# Patient Record
Sex: Male | Born: 1980 | Race: White | Hispanic: No | Marital: Single | Smoking: Current some day smoker
Health system: Southern US, Community
[De-identification: ages and names within clinical notes are randomized; demographics above are authoritative.]

## PROBLEM LIST (undated history)

## (undated) DIAGNOSIS — K8020 Calculus of gallbladder without cholecystitis without obstruction: Secondary | ICD-10-CM

## (undated) DIAGNOSIS — K838 Other specified diseases of biliary tract: Secondary | ICD-10-CM

## (undated) DIAGNOSIS — K8050 Calculus of bile duct without cholangitis or cholecystitis without obstruction: Secondary | ICD-10-CM

## (undated) DIAGNOSIS — K760 Fatty (change of) liver, not elsewhere classified: Secondary | ICD-10-CM

## (undated) DIAGNOSIS — L4050 Arthropathic psoriasis, unspecified: Secondary | ICD-10-CM

## (undated) HISTORY — DX: Calculus of bile duct without cholangitis or cholecystitis without obstruction: K80.50

## (undated) HISTORY — DX: Other specified diseases of biliary tract: K83.8

## (undated) HISTORY — PX: ANTERIOR CRUCIATE LIGAMENT REPAIR: SHX115

## (undated) HISTORY — PX: DENTAL SURGERY: SHX609

## (undated) HISTORY — DX: Fatty (change of) liver, not elsewhere classified: K76.0

## (undated) HISTORY — DX: Calculus of gallbladder without cholecystitis without obstruction: K80.20

---

## 2013-05-07 ENCOUNTER — Encounter (HOSPITAL_COMMUNITY): Payer: Self-pay

## 2013-05-07 ENCOUNTER — Emergency Department (HOSPITAL_COMMUNITY)
Admission: EM | Admit: 2013-05-07 | Discharge: 2013-05-07 | Disposition: A | Discharge status: Home / Self Care | Payer: 59 | Attending: Emergency Medicine | Admitting: Emergency Medicine

## 2013-05-07 DIAGNOSIS — R1013 Epigastric pain: Secondary | ICD-10-CM | POA: Insufficient documentation

## 2013-05-07 DIAGNOSIS — R109 Unspecified abdominal pain: Secondary | ICD-10-CM

## 2013-05-07 DIAGNOSIS — F17200 Nicotine dependence, unspecified, uncomplicated: Secondary | ICD-10-CM | POA: Insufficient documentation

## 2013-05-07 LAB — URINALYSIS, ROUTINE W REFLEX MICROSCOPIC
Bilirubin Urine: NEGATIVE
Glucose, UA: NEGATIVE mg/dL
Hgb urine dipstick: NEGATIVE
Ketones, ur: NEGATIVE mg/dL
Leukocytes, UA: NEGATIVE
pH: 6.5 (ref 5.0–8.0)

## 2013-05-07 LAB — COMPREHENSIVE METABOLIC PANEL
ALT: 18 U/L (ref 0–53)
AST: 13 U/L (ref 0–37)
Albumin: 4.2 g/dL (ref 3.5–5.2)
Alkaline Phosphatase: 81 U/L (ref 39–117)
Chloride: 103 mEq/L (ref 96–112)
Potassium: 3.4 mEq/L — ABNORMAL LOW (ref 3.5–5.1)
Sodium: 141 mEq/L (ref 135–145)
Total Protein: 7.1 g/dL (ref 6.0–8.3)

## 2013-05-07 LAB — CBC WITH DIFFERENTIAL/PLATELET
Basophils Relative: 0 % (ref 0–1)
Eosinophils Absolute: 0.2 10*3/uL (ref 0.0–0.7)
MCH: 30.6 pg (ref 26.0–34.0)
MCHC: 34.7 g/dL (ref 30.0–36.0)
Neutro Abs: 5.7 10*3/uL (ref 1.7–7.7)
Neutrophils Relative %: 67 % (ref 43–77)
Platelets: 232 10*3/uL (ref 150–400)
RBC: 4.35 MIL/uL (ref 4.22–5.81)

## 2013-05-07 MED ORDER — GI COCKTAIL ~~LOC~~: 30.0000 mL | ORAL | Status: DC | PRN

## 2013-05-07 MED ORDER — PANTOPRAZOLE SODIUM 20 MG PO TBEC: 20.0000 mg | DELAYED_RELEASE_TABLET | Freq: Every day | ORAL | Status: DC

## 2013-05-07 MED ORDER — TRAMADOL HCL 50 MG PO TABS
50.0000 mg | ORAL_TABLET | Freq: Four times a day (QID) | ORAL | Status: DC | PRN
Start: 2013-05-07 — End: 2016-01-30

## 2013-05-07 MED ORDER — PANTOPRAZOLE SODIUM 40 MG IV SOLR
40.0000 mg | INTRAVENOUS | Status: AC
  Administered 2013-05-07: 40 mg via INTRAVENOUS
  Filled 2013-05-07: qty 40

## 2013-05-07 MED ORDER — GI COCKTAIL ~~LOC~~
30.0000 mL | Freq: Once | ORAL | Status: AC
  Administered 2013-05-07: 30 mL via ORAL
  Filled 2013-05-07: qty 30

## 2013-05-07 NOTE — ED Notes (Signed)
Patient reports that he has had intermittent epigastric pain and lower abdominal bloating. Patient states that for the past week he has had more frequent episodes and lasting longer. He c/o epigastric pain lasting greater than 12 hours last night and this AM. Patient describes pain as aching tht radiates into his mid back.

## 2013-05-07 NOTE — ED Notes (Signed)
Patient explained that he preferred not to have blood work drawn until seen by a physician.

## 2013-05-07 NOTE — ED Provider Notes (Signed)
History     CSN: [id number]  Arrival date & time 05/07/13  0911   First MD Initiated Contact with Patient 05/07/13 0913      Chief Complaint  Patient presents with  . Abdominal Pain    (Consider location/radiation/quality/duration/timing/severity/associated sxs/prior treatment) HPI Comments: Patient is a 32 year old male with no past medical history who presents with a 1 week history of abdominal pain. The pain is located in his epigastrium and radiates to his mid back. The pain is described as aching and moderate. The pain is intermittent without known trigger. Patient reports noticing the pain after drinking orange juice. The pain started gradually and progressively worsened since the onset. The pain will last 24-48 hours before resolving. The pain usually starts at night. Patient tried TUMS for pain which provided relief, but another time he tried them he had no relief. Associated symptoms include bloating and gas. Patient states he has been experiencing this problem for a while but has noticed more frequent episodes in the past week. Patient denies fever, headache, NVD, chest pain, SOB, dysuria, constipation.    Patient is a 32 y.o. male presenting with abdominal pain.  Abdominal Pain Associated symptoms include abdominal pain.    History reviewed. No pertinent past medical history.  Past Surgical History  Procedure Laterality Date  . Anterior cruciate ligament repair Right   . Dental surgery      Family History  Problem Relation Age of Onset  . Cancer Maternal Aunt     History  Substance Use Topics  . Smoking status: Current Some Day Smoker  . Smokeless tobacco: Never Used  . Alcohol Use: Yes     Comment: rarely      Review of Systems  Gastrointestinal: Positive for abdominal pain.  All other systems reviewed and are negative.    Allergies  Review of patient's allergies indicates not on file.  Home Medications  No current outpatient prescriptions on  file.  BP 158/88  Pulse 63  Temp(Src) 98 F (36.7 C) (Oral)  Resp 18  Ht 6' (1.829 m)  Wt 220 lb (99.791 kg)  BMI 29.83 kg/m2  SpO2 98%  Physical Exam  Nursing note and vitals reviewed. Constitutional: He is oriented to person, place, and time. He appears well-developed and well-nourished. No distress.  HENT:  Head: Normocephalic and atraumatic.  Eyes: Conjunctivae are normal. No scleral icterus.  Neck: Normal range of motion.  Cardiovascular: Normal rate and regular rhythm.  Exam reveals no gallop and no friction rub.   No murmur heard. Pulmonary/Chest: Effort normal and breath sounds normal. He has no wheezes. He has no rales. He exhibits no tenderness.  Abdominal: Soft. He exhibits no distension. There is tenderness. There is no rebound and no guarding.  Mild epigastric tenderness to palpation. No peritoneal signs or focal areas of tenderness.   Musculoskeletal: Normal range of motion.  Neurological: He is alert and oriented to person, place, and time. Coordination normal.  Speech is goal-oriented. Moves limbs without ataxia.   Skin: Skin is warm and dry.  Psychiatric: He has a normal mood and affect. His behavior is normal.    ED Course  Procedures (including critical care time)  Labs Reviewed  CBC WITH DIFFERENTIAL - Abnormal; Notable for the following:    HCT 38.3 (*)    All other components within normal limits  COMPREHENSIVE METABOLIC PANEL - Abnormal; Notable for the following:    Potassium 3.4 (*)    Glucose, Bld 135 (*)  All other components within normal limits  URINALYSIS, ROUTINE W REFLEX MICROSCOPIC - Abnormal; Notable for the following:    APPearance CLOUDY (*)    All other components within normal limits  LIPASE, BLOOD   No results found.   1. Abdominal pain       MDM  10:17 AM Labs pending. Patient will have GI cocktail and IV protonix for symptoms. Vitals stable and patient afebrile.   12:00 PM Patient reports some relief. Labs  unremarkable for acute changes. Patient will have prescription for daily protonix and GI cocktail. Patient will have recommended GI cocktail. Vitals stable and patient afebrile.   Patient feeling better. Patient will have Protonix prescription and instructions to follow up with GI. Vitals stable and patient afebrile. Patient instructed to return with worsening or concerning symptoms.     Emilia Beck, PA-C 05/09/13 1039

## 2013-05-10 NOTE — ED Provider Notes (Signed)
Medical screening examination/treatment/procedure(s) were conducted as a shared visit with non-physician practitioner(s) and myself.  I personally evaluated the patient during the encounter.  Kendarius Vigen, MD 05/10/13 0740 

## 2016-01-30 ENCOUNTER — Encounter (HOSPITAL_COMMUNITY): Payer: Self-pay | Admitting: Emergency Medicine

## 2016-01-30 ENCOUNTER — Emergency Department (HOSPITAL_COMMUNITY)
Admission: EM | Admit: 2016-01-30 | Discharge: 2016-01-30 | Disposition: A | Discharge status: Home / Self Care | Payer: 59 | Attending: Emergency Medicine | Admitting: Emergency Medicine

## 2016-01-30 ENCOUNTER — Emergency Department (HOSPITAL_COMMUNITY): Payer: 59

## 2016-01-30 DIAGNOSIS — R1010 Upper abdominal pain, unspecified: Secondary | ICD-10-CM | POA: Insufficient documentation

## 2016-01-30 DIAGNOSIS — R1011 Right upper quadrant pain: Secondary | ICD-10-CM

## 2016-01-30 DIAGNOSIS — F17200 Nicotine dependence, unspecified, uncomplicated: Secondary | ICD-10-CM | POA: Diagnosis not present

## 2016-01-30 DIAGNOSIS — M549 Dorsalgia, unspecified: Secondary | ICD-10-CM | POA: Diagnosis not present

## 2016-01-30 DIAGNOSIS — R109 Unspecified abdominal pain: Secondary | ICD-10-CM | POA: Diagnosis present

## 2016-01-30 DIAGNOSIS — Z79899 Other long term (current) drug therapy: Secondary | ICD-10-CM | POA: Diagnosis not present

## 2016-01-30 DIAGNOSIS — Z872 Personal history of diseases of the skin and subcutaneous tissue: Secondary | ICD-10-CM | POA: Insufficient documentation

## 2016-01-30 HISTORY — DX: Arthropathic psoriasis, unspecified: L40.50

## 2016-01-30 LAB — URINALYSIS, ROUTINE W REFLEX MICROSCOPIC
Bilirubin Urine: NEGATIVE
GLUCOSE, UA: NEGATIVE mg/dL
HGB URINE DIPSTICK: NEGATIVE
Ketones, ur: NEGATIVE mg/dL
Leukocytes, UA: NEGATIVE
Nitrite: NEGATIVE
PH: 7 (ref 5.0–8.0)
PROTEIN: NEGATIVE mg/dL
Specific Gravity, Urine: 1.029 (ref 1.005–1.030)

## 2016-01-30 LAB — COMPREHENSIVE METABOLIC PANEL
ALK PHOS: 78 U/L (ref 38–126)
ALT: 41 U/L (ref 17–63)
AST: 21 U/L (ref 15–41)
Albumin: 4.3 g/dL (ref 3.5–5.0)
Anion gap: 11 (ref 5–15)
BUN: 13 mg/dL (ref 6–20)
CHLORIDE: 106 mmol/L (ref 101–111)
CO2: 26 mmol/L (ref 22–32)
CREATININE: 0.97 mg/dL (ref 0.61–1.24)
Calcium: 9.4 mg/dL (ref 8.9–10.3)
GFR calc Af Amer: >60 mL/min (ref >60)
GFR calc non Af Amer: >60 mL/min (ref >60)
GLUCOSE: 109 mg/dL — AB (ref 65–99)
Potassium: 4.1 mmol/L (ref 3.5–5.1)
SODIUM: 143 mmol/L (ref 135–145)
Total Bilirubin: 0.6 mg/dL (ref 0.3–1.2)
Total Protein: 7.8 g/dL (ref 6.5–8.1)

## 2016-01-30 LAB — CBC
HCT: 42.1 % (ref 39.0–52.0)
Hemoglobin: 14.3 g/dL (ref 13.0–17.0)
MCH: 30.5 pg (ref 26.0–34.0)
MCHC: 34 g/dL (ref 30.0–36.0)
MCV: 89.8 fL (ref 78.0–100.0)
PLATELETS: 307 10*3/uL (ref 150–400)
RBC: 4.69 MIL/uL (ref 4.22–5.81)
RDW: 14 % (ref 11.5–15.5)
WBC: 9.3 10*3/uL (ref 4.0–10.5)

## 2016-01-30 LAB — LIPASE, BLOOD: LIPASE: 34 U/L (ref 11–51)

## 2016-01-30 MED ORDER — PANTOPRAZOLE SODIUM 40 MG IV SOLR
40.0000 mg | Freq: Once | INTRAVENOUS | Status: AC
  Administered 2016-01-30: 40 mg via INTRAVENOUS
  Filled 2016-01-30: qty 40

## 2016-01-30 MED ORDER — FAMOTIDINE IN NACL 20-0.9 MG/50ML-% IV SOLN
20.0000 mg | Freq: Once | INTRAVENOUS | Status: AC
  Administered 2016-01-30: 20 mg via INTRAVENOUS
  Filled 2016-01-30: qty 50

## 2016-01-30 MED ORDER — PANTOPRAZOLE SODIUM 20 MG PO TBEC: 20.0000 mg | DELAYED_RELEASE_TABLET | Freq: Two times a day (BID) | ORAL | Status: DC

## 2016-01-30 NOTE — ED Provider Notes (Signed)
CSN: [id number]     Arrival date & time 01/30/16  0146 History  By signing my name below, I, Linus GalasMaharshi Patel, attest that this documentation has been prepared under the direction and in the presence of Raeford RazorStephen Sayyid Harewood, MD. Electronically Signed: Linus GalasMaharshi Patel, ED Scribe. 01/30/2016. 3:56 AM.  Chief Complaint  Patient presents with  . Abdominal Pain   The history is provided by the patient. No language interpreter was used.   HPI Comments: Craig Roth is a 35 y.o. male who presents to the Emergency Department complaining of stabbing abdominal pain that has been ongoing for years but has worsened for the past 3 days. Pt notes that lately he has been having more frequent episodes of abdominal pain. He notes worsening pain after eating cheese and fatty foods. Pt states his pain radiates to his back. He has been taking alka seltzer and pepto-bismol with mild relief.  Pt denies any fevers, chills, nausea, vomiting, diarrhea, urinary symptoms, or any other symptoms at this time.   Past Medical History  Diagnosis Date  . Psoriatic arthritis [facility](HCC)    Past Surgical History  Procedure Laterality Date  . Anterior cruciate ligament repair Right   . Dental surgery     Family History  Problem Relation Age of Onset  . Cancer Maternal Aunt    Social History  Substance Use Topics  . Smoking status: Current Some Day Smoker  . Smokeless tobacco: Never Used  . Alcohol Use: Yes     Comment: rarely    Review of Systems  Constitutional: Negative for fever and chills.  Gastrointestinal: Positive for abdominal pain. Negative for nausea, vomiting and diarrhea.  Genitourinary: Negative for urgency, frequency and difficulty urinating.  Musculoskeletal: Positive for back pain.  All other systems reviewed and are negative.   Allergies  Review of patient's allergies indicates no known allergies.  Home Medications   Prior to Admission medications   Medication Sig Start Date End Date Taking? Authorizing  Provider  Alum & Mag Hydroxide-Simeth (GI COCKTAIL) SUSP suspension Take 30 mLs by mouth as needed for indigestion. Shake well. 05/07/13   Emilia BeckKaitlyn Szekalski, PA-C  Multiple Vitamin (MULTIVITAMIN WITH MINERALS) TABS Take 1 tablet by mouth every morning.    Historical Provider, MD  pantoprazole (PROTONIX) 20 MG tablet Take 1 tablet (20 mg total) by mouth daily. 05/07/13   Kaitlyn Szekalski, PA-C  traMADol (ULTRAM) 50 MG tablet Take 1 tablet (50 mg total) by mouth every 6 (six) hours as needed for pain. 05/07/13   Kaitlyn Szekalski, PA-C   BP 139/87 mmHg  Pulse 98  Temp(Src) 98.2 F (36.8 C) (Oral)  Resp 18  SpO2 100%   Physical Exam  Constitutional: He appears well-developed and well-nourished. No distress.  HENT:  Head: Normocephalic and atraumatic.  Right Ear: External ear normal.  Left Ear: External ear normal.  Eyes: Conjunctivae are normal. Right eye exhibits no discharge. Left eye exhibits no discharge. No scleral icterus.  Neck: Neck supple. No tracheal deviation present.  Cardiovascular: Normal rate, regular rhythm and intact distal pulses.   Pulmonary/Chest: Effort normal and breath sounds normal. No stridor. No respiratory distress. He has no wheezes. He has no rales.  Abdominal: Soft. Bowel sounds are normal. He exhibits no distension. There is no tenderness. There is no rebound and no guarding.  Musculoskeletal: He exhibits no edema or tenderness.  Neurological: He is alert. He has normal strength. No cranial nerve deficit (no facial droop, extraocular movements intact, no slurred speech) or sensory deficit.  He exhibits normal muscle tone. He displays no seizure activity. Coordination normal.  Skin: Skin is warm and dry. No rash noted.  Psychiatric: He has a normal mood and affect.  Nursing note and vitals reviewed.   ED Course  Procedures   DIAGNOSTIC STUDIES: Oxygen Saturation is 100% on room air, normal by my interpretation.    COORDINATION OF CARE: 3:50 AM Will order  blood work and urinalysis. Discussed treatment plan with pt at bedside and pt agreed to plan.   Labs Review Labs Reviewed  COMPREHENSIVE METABOLIC PANEL - Abnormal; Notable for the following:    Glucose, Bld 109 (*)    All other components within normal limits  LIPASE, BLOOD  CBC  URINALYSIS, ROUTINE W REFLEX MICROSCOPIC (NOT AT [facility])    Imaging Review No results found.   US Abdomen Limited Ruq  01/30/2016  CLINICAL DATA:  Right upper quadrant pain for 3 days EXAM: US ABDOMEN LIMITED - RIGHT UPPER QUADRANT COMPARISON:  None. FINDINGS: Gallbladder: No gallstones or wall thickening visualized. No sonographic Murphy sign noted by sonographer. Common bile duct: Diameter: 17 mm. Where visualized, no filling defect. This marked dilatation could be from occult obstructive process or congenital dilatation. Liver: Echogenic liver without focal mass lesion. IMPRESSION: 1. Dilated common bile duct (17 mm) without visible cause. Recommend follow-up with MRCP. 2. Negative gallbladder. 3. Hepatic steatosis. Electronically Signed   By: Marnee Spring M.D.   On: 01/30/2016 04:54   I have personally reviewed and evaluated these images and lab results as part of my medical decision-making.   EKG Interpretation None      MDM   Final diagnoses:  Pain of upper abdomen    34yM with upper abdominal pain. CBD very dilated. Congenital? Symptoms began again 3 days ago and I would expect some lab abnormalities with symptom duration this long. Abdominal exam benign. At this point, I feel safe for discharge. Strict return precautions discussed. GI FU. Trial of PPI.  I personally preformed the services scribed in my presence. The recorded information has been reviewed is accurate. Raeford Razor, MD.    Raeford Razor, MD 02/04/16 [phone]

## 2016-01-30 NOTE — Discharge Instructions (Signed)
Your ultrasound today showed that your common bile duct is significantly dilated.  This may be congenital. This can be caused by blockages in your bile duct system, but you do not have any lab abnormalities which would suggest this. You are going to be started on a proton pump inhibitor (protonix). I am recommending you follow up with GI for persistent symptoms.   Abdominal Pain, Adult Many things can cause abdominal pain. Usually, abdominal pain is not caused by a disease and will improve without treatment. It can often be observed and treated at home. Your health care provider will do a physical exam and possibly order blood tests and X-rays to help determine the seriousness of your pain. However, in many cases, more time must pass before a clear cause of the pain can be found. Before that point, your health care provider may not know if you need more testing or further treatment. HOME CARE INSTRUCTIONS Monitor your abdominal pain for any changes. The following actions may help to alleviate any discomfort you are experiencing:  Only take over-the-counter or prescription medicines as directed by your health care provider.  Do not take laxatives unless directed to do so by your health care provider.  Try a clear liquid diet (broth, tea, or water) as directed by your health care provider. Slowly move to a bland diet as tolerated. SEEK MEDICAL CARE IF:  You have unexplained abdominal pain.  You have abdominal pain associated with nausea or diarrhea.  You have pain when you urinate or have a bowel movement.  You experience abdominal pain that wakes you in the night.  You have abdominal pain that is worsened or improved by eating food.  You have abdominal pain that is worsened with eating fatty foods.  You have a fever. SEEK IMMEDIATE MEDICAL CARE IF:  Your pain does not go away within 2 hours.  You keep throwing up (vomiting).  Your pain is felt only in portions of the abdomen, such as  the right side or the left lower portion of the abdomen.  You pass bloody or black tarry stools. MAKE SURE YOU:  Understand these instructions.  Will watch your condition.  Will get help right away if you are not doing well or get worse.   This information is not intended to replace advice given to you by your health care provider. Make sure you discuss any questions you have with your health care provider.   Document Released: 08/15/2005 Document Revised: 07/27/2015 Document Reviewed: 07/15/2013 Elsevier Interactive Patient Education [employer]2016 Elsevier Inc.

## 2016-01-30 NOTE — ED Notes (Signed)
Pt c/o upper abd pain x 3 days.  Denies any NVD.  Pt denies any major medical problems.

## 2016-02-13 ENCOUNTER — Encounter: Payer: Self-pay | Admitting: Internal Medicine

## 2016-02-18 ENCOUNTER — Encounter (HOSPITAL_COMMUNITY): Payer: Self-pay | Admitting: Emergency Medicine

## 2016-02-18 ENCOUNTER — Emergency Department (HOSPITAL_COMMUNITY): Payer: 59

## 2016-02-18 ENCOUNTER — Inpatient Hospital Stay (HOSPITAL_COMMUNITY)
Admission: EM | Admit: 2016-02-18 | Discharge: 2016-02-23 | DRG: 419 | Disposition: A | Discharge status: Home / Self Care | Payer: 59 | Attending: General Surgery | Admitting: General Surgery

## 2016-02-18 DIAGNOSIS — K8042 Calculus of bile duct with acute cholecystitis without obstruction: Secondary | ICD-10-CM | POA: Diagnosis present

## 2016-02-18 DIAGNOSIS — R1010 Upper abdominal pain, unspecified: Secondary | ICD-10-CM

## 2016-02-18 DIAGNOSIS — Z6832 Body mass index (BMI) 32.0-32.9, adult: Secondary | ICD-10-CM | POA: Diagnosis not present

## 2016-02-18 DIAGNOSIS — R109 Unspecified abdominal pain: Secondary | ICD-10-CM | POA: Diagnosis present

## 2016-02-18 DIAGNOSIS — K8067 Calculus of gallbladder and bile duct with acute and chronic cholecystitis with obstruction: Principal | ICD-10-CM | POA: Diagnosis present

## 2016-02-18 DIAGNOSIS — Z09 Encounter for follow-up examination after completed treatment for conditions other than malignant neoplasm: Secondary | ICD-10-CM

## 2016-02-18 DIAGNOSIS — F17200 Nicotine dependence, unspecified, uncomplicated: Secondary | ICD-10-CM | POA: Diagnosis present

## 2016-02-18 DIAGNOSIS — K8050 Calculus of bile duct without cholangitis or cholecystitis without obstruction: Secondary | ICD-10-CM

## 2016-02-18 DIAGNOSIS — K8020 Calculus of gallbladder without cholecystitis without obstruction: Secondary | ICD-10-CM | POA: Diagnosis present

## 2016-02-18 LAB — COMPREHENSIVE METABOLIC PANEL
ALBUMIN: 4.1 g/dL (ref 3.5–5.0)
ALT: 234 U/L — ABNORMAL HIGH (ref 17–63)
AST: 295 U/L — AB (ref 15–41)
Alkaline Phosphatase: 93 U/L (ref 38–126)
Anion gap: 9 (ref 5–15)
BUN: 9 mg/dL (ref 6–20)
CHLORIDE: 110 mmol/L (ref 101–111)
CO2: 26 mmol/L (ref 22–32)
Calcium: 9.2 mg/dL (ref 8.9–10.3)
Creatinine, Ser: 0.94 mg/dL (ref 0.61–1.24)
GFR calc Af Amer: >60 mL/min (ref >60)
GFR calc non Af Amer: >60 mL/min (ref >60)
GLUCOSE: 112 mg/dL — AB (ref 65–99)
POTASSIUM: 4.8 mmol/L (ref 3.5–5.1)
SODIUM: 145 mmol/L (ref 135–145)
Total Bilirubin: 1.1 mg/dL (ref 0.3–1.2)
Total Protein: 7.3 g/dL (ref 6.5–8.1)

## 2016-02-18 LAB — URINALYSIS, ROUTINE W REFLEX MICROSCOPIC
Bilirubin Urine: NEGATIVE
Glucose, UA: NEGATIVE mg/dL
KETONES UR: NEGATIVE mg/dL
LEUKOCYTES UA: NEGATIVE
Nitrite: NEGATIVE
PROTEIN: NEGATIVE mg/dL
Specific Gravity, Urine: 1.019 (ref 1.005–1.030)
pH: 6.5 (ref 5.0–8.0)

## 2016-02-18 LAB — CBC
HCT: 42.7 % (ref 39.0–52.0)
Hemoglobin: 14.3 g/dL (ref 13.0–17.0)
MCH: 28.7 pg (ref 26.0–34.0)
MCHC: 33.5 g/dL (ref 30.0–36.0)
MCV: 85.7 fL (ref 78.0–100.0)
PLATELETS: 333 10*3/uL (ref 150–400)
RBC: 4.98 MIL/uL (ref 4.22–5.81)
RDW: 13.9 % (ref 11.5–15.5)
WBC: 10 10*3/uL (ref 4.0–10.5)

## 2016-02-18 LAB — URINE MICROSCOPIC-ADD ON
BACTERIA UA: NONE SEEN
Squamous Epithelial / LPF: NONE SEEN

## 2016-02-18 LAB — LIPASE, BLOOD: Lipase: 27 U/L (ref 11–51)

## 2016-02-18 MED ORDER — SIMETHICONE 80 MG PO CHEW: 40.0000 mg | CHEWABLE_TABLET | Freq: Four times a day (QID) | ORAL | Status: DC | PRN

## 2016-02-18 MED ORDER — IOPAMIDOL (ISOVUE-300) INJECTION 61%
100.0000 mL | Freq: Once | INTRAVENOUS | Status: AC | PRN
  Administered 2016-02-18: 100 mL via INTRAVENOUS

## 2016-02-18 MED ORDER — PIPERACILLIN-TAZOBACTAM 3.375 G IVPB
3.3750 g | Freq: Three times a day (TID) | INTRAVENOUS | Status: DC
  Administered 2016-02-18 – 2016-02-23 (×13): 3.375 g via INTRAVENOUS
  Filled 2016-02-18 (×17): qty 50

## 2016-02-18 MED ORDER — SODIUM CHLORIDE 0.9 % IV SOLN
INTRAVENOUS | Status: DC
  Administered 2016-02-18 – 2016-02-19 (×2): via INTRAVENOUS
  Administered 2016-02-21: 1000 mL via INTRAVENOUS

## 2016-02-18 MED ORDER — ENOXAPARIN SODIUM 40 MG/0.4ML ~~LOC~~ SOLN
40.0000 mg | Freq: Every day | SUBCUTANEOUS | Status: DC
  Administered 2016-02-18 – 2016-02-20 (×3): 40 mg via SUBCUTANEOUS
  Filled 2016-02-18 (×4): qty 0.4

## 2016-02-18 MED ORDER — IOHEXOL 300 MG/ML  SOLN
25.0000 mL | Freq: Once | INTRAMUSCULAR | Status: AC | PRN
  Administered 2016-02-18: 25 mL via ORAL

## 2016-02-18 MED ORDER — SODIUM CHLORIDE 0.9 % IV BOLUS (SEPSIS)
1000.0000 mL | Freq: Once | INTRAVENOUS | Status: AC
  Administered 2016-02-18: 1000 mL via INTRAVENOUS

## 2016-02-18 MED ORDER — MORPHINE SULFATE (PF) 2 MG/ML IV SOLN
2.0000 mg | INTRAVENOUS | Status: DC | PRN
  Administered 2016-02-18 – 2016-02-20 (×13): 4 mg via INTRAVENOUS
  Administered 2016-02-21: 2 mg via INTRAVENOUS
  Administered 2016-02-21 (×2): 4 mg via INTRAVENOUS
  Administered 2016-02-21 – 2016-02-22 (×2): 2 mg via INTRAVENOUS
  Administered 2016-02-22 (×3): 4 mg via INTRAVENOUS
  Filled 2016-02-18: qty 2
  Filled 2016-02-18: qty 1
  Filled 2016-02-18 (×5): qty 2
  Filled 2016-02-18: qty 1
  Filled 2016-02-18 (×10): qty 2
  Filled 2016-02-18: qty 1
  Filled 2016-02-18 (×2): qty 2

## 2016-02-18 MED ORDER — MORPHINE SULFATE (PF) 4 MG/ML IV SOLN
4.0000 mg | Freq: Once | INTRAVENOUS | Status: AC
  Administered 2016-02-18: 4 mg via INTRAVENOUS
  Filled 2016-02-18: qty 1

## 2016-02-18 MED ORDER — ONDANSETRON 4 MG PO TBDP
4.0000 mg | ORAL_TABLET | Freq: Four times a day (QID) | ORAL | Status: DC | PRN
Start: 2016-02-18 — End: 2016-02-23

## 2016-02-18 MED ORDER — PANTOPRAZOLE SODIUM 20 MG PO TBEC
20.0000 mg | DELAYED_RELEASE_TABLET | Freq: Two times a day (BID) | ORAL | Status: DC
  Administered 2016-02-19 – 2016-02-23 (×6): 20 mg via ORAL
  Filled 2016-02-18 (×11): qty 1

## 2016-02-18 MED ORDER — ONDANSETRON HCL 4 MG/2ML IJ SOLN
4.0000 mg | Freq: Four times a day (QID) | INTRAMUSCULAR | Status: DC | PRN
  Administered 2016-02-20: 4 mg via INTRAVENOUS

## 2016-02-18 NOTE — ED Provider Notes (Signed)
CSN: [id number]     Arrival date & time 02/18/16  1501 History   First MD Initiated Contact with Patient 02/18/16 1518     Chief Complaint  Patient presents with  . Abdominal Pain  . Emesis  . Diarrhea   (Consider location/radiation/quality/duration/timing/severity/associated sxs/prior Treatment) HPI 35 y.o. male presents to the Emergency Department today complaining of upper abdominal pain that has been ongoing for several years, but worsening within the past few weeks. States that he was seen in the ED on the 13th due to similar symptoms. They did a RUQ [place] and found a dilated common bile duct with no stones present. DCed with follow up to GI on May 15th. Pt presents due to new onset N/V after drinking water today. Noted that he tolerated oatmeal this morning without difficulty. States pain is 8/10 and is banded across his abdomen with intermittent RUQ pain that is sharp with no inciting factors. Pt notes that he can't sleep due to the pain. Patient states that he has been careful with watching what he eats as well as avoiding drinking alcohol. No fevers. No CP/SOB. No headache. No numbness/tingling. No other symptoms noted.   Past Medical History  Diagnosis Date  . Psoriatic arthritis [facility])    Past Surgical History  Procedure Laterality Date  . Anterior cruciate ligament repair Right   . Dental surgery     Family History  Problem Relation Age of Onset  . Cancer Maternal Aunt    Social History  Substance Use Topics  . Smoking status: Current Some Day Smoker  . Smokeless tobacco: Never Used  . Alcohol Use: Yes     Comment: rarely    Review of Systems ROS reviewed and all are negative for acute change except as noted in the HPI.  Allergies  Review of patient's allergies indicates no known allergies.  Home Medications   Prior to Admission medications   Medication Sig Start Date End Date Taking? Authorizing Provider  bismuth subsalicylate (PEPTO BISMOL) 262 MG/15ML suspension  Take 30 mLs by mouth every 6 (six) hours as needed (for upset stomach).    Historical Provider, MD  Multiple Vitamin (MULTIVITAMIN WITH MINERALS) TABS Take 1 tablet by mouth every morning.    Historical Provider, MD  Omega-3 Fatty Acids (FISH OIL) 500 MG CAPS Take 500 mg by mouth daily.    Historical Provider, MD  pantoprazole (PROTONIX) 20 MG tablet Take 1 tablet (20 mg total) by mouth 2 (two) times daily before a meal. 01/30/16   Raeford Razor, MD  Probiotic Product (PROBIOTIC PO) Take 1 capsule by mouth daily.    Historical Provider, MD  sodium-potassium bicarbonate (ALKA-SELTZER GOLD) TBEF dissolvable tablet Take 1 tablet by mouth 2 (two) times daily as needed (for indigestion/stomach pain.).    Historical Provider, MD   BP 158/112 mmHg  Pulse 103  Temp(Src) 98 F (36.7 C) (Oral)  Resp 18  SpO2 95%   Physical Exam  Constitutional: He is oriented to person, place, and time. He appears well-developed and well-nourished.  HENT:  Head: Normocephalic and atraumatic.  Eyes: EOM are normal. Pupils are equal, round, and reactive to light.  Neck: Normal range of motion. Neck supple. No tracheal deviation present.  Cardiovascular: Normal rate, regular rhythm and normal heart sounds.   No murmur heard. Pulmonary/Chest: Effort normal and breath sounds normal. No respiratory distress. He has no wheezes. He has no rales. He exhibits no tenderness.  Abdominal: Soft. Normal appearance and bowel sounds are normal. There  is tenderness in the right upper quadrant. There is no rigidity, no rebound, no guarding, no CVA tenderness and no tenderness at McBurney's point.  Musculoskeletal: Normal range of motion.  Neurological: He is alert and oriented to person, place, and time.  Skin: Skin is warm and dry.  Psychiatric: He has a normal mood and affect. His behavior is normal. Thought content normal.  Nursing note and vitals reviewed.  ED Course  Procedures (including critical care time) Labs Review Labs  Reviewed  URINALYSIS, ROUTINE W REFLEX MICROSCOPIC (NOT AT [facility]) - Abnormal; Notable for the following:    Hgb urine dipstick TRACE (*)    All other components within normal limits  COMPREHENSIVE METABOLIC PANEL - Abnormal; Notable for the following:    Glucose, Bld 112 (*)    AST 295 (*)    ALT 234 (*)    All other components within normal limits  CBC  LIPASE, BLOOD  URINE MICROSCOPIC-ADD ON   Imaging Review Ct Abdomen Pelvis W Contrast  02/18/2016  CLINICAL DATA:  Umbilical pain 5 days with nausea, vomiting and diarrhea. EXAM: CT ABDOMEN AND PELVIS WITH CONTRAST TECHNIQUE: Multidetector CT imaging of the abdomen and pelvis was performed using the standard protocol following bolus administration of intravenous contrast. CONTRAST:  25mL OMNIPAQUE IOHEXOL 300 MG/ML SOLN, ISOVUE-300 IOPAMIDOL (ISOVUE-300) INJECTION 61% COMPARISON:  None. FINDINGS: Lung bases demonstrate minimal dependent bibasilar atelectasis. 3 mm nodule over the right middle lobe. Abdominal images demonstrate mild distention of the gallbladder. There is dilatation of the common bowel duct during 1.5 cm with a 7-8 mm stone over the distal common bowel duct at the head of the pancreas. Subtle prominence of the central intrahepatic ducts. The spleen, pancreas and adrenal glands are normal. Stomach is normal. Kidneys are normal in size without hydronephrosis or nephrolithiasis. Ureters are normal. Vascular structures are within normal. Appendix is normal.  Colon small bowel are within normal. Pelvic images demonstrate the bladder, prostate and rectum to be within normal. The remaining bones and soft tissues are within normal. IMPRESSION: 7-8 mm stone over the distal common bile duct causing dilatation of the common bile duct measuring 1.5 cm and mild gallbladder distention. Electronically Signed   By: Elberta Fortis M.D.   On: 02/18/2016 19:31   I have personally reviewed and evaluated these images and lab results as part of my  medical decision-making.   EKG Interpretation None      MDM  I have reviewed and evaluated the relevant laboratory values. I have reviewed and evaluated the relevant imaging studies.  I have reviewed the relevant previous healthcare records. I obtained HPI from historian. Patient discussed with supervising physician  ED Course:  Assessment: Pt is a 34yM who presents with upper abdominal pain x sever years but worsening recently. Seen on 3-13 for similar complaint. RUQ showed Dilated Common bile duct with no stones. Questionable congenital abnormality. Has follow up with GI on the 15th. On exam, pt in NAD. Nontoxic/nonseptic appearing. VSS. Afebrile. Lungs CTA. Heart RRR. Abdomen TTP RUQ and LUQ. Neg Murphys. Labs unremarakble. Discussed with supervising physician and feel imaging is unnecessary. Patient does not meet the SIRS or Sepsis criteria.  On repeat exam patient does not have a surgical abdomen and there are no peritoneal signs.  No indication of appendicitis, bowel obstruction, bowel perforation, cholecystitis, diverticulitis. Given fluids and analgesia in ED. Plan is to DC Home with follow up to GI at scheduled appointment. Given Rx for Zofran and analgesia. At time  of discharge, Patient is in no acute distress. Vital Signs are stable. Patient is able to ambulate. Patient able to tolerate PO.  7:55 PM- Consult with General Surgery (Dr. Sheliah HatchKinsinger) will admit with GI to follow for MRCP   Disposition/Plan:  DC Home Additional Verbal discharge instructions given and discussed with patient.  Pt Instructed to f/u with GI at scheduled appointment for evaluation and treatment of symptoms. Return precautions given Pt acknowledges and agrees with plan  Supervising Physician Cathren LaineKevin Steinl, MD   Final diagnoses:  Pain of upper abdomen  Choledocholithiasis with acute cholecystitis        Audry Piliyler Jenner Rosier, PA-C 02/18/16 2002  Cathren LaineKevin Steinl, MD 02/18/16 2312

## 2016-02-18 NOTE — H&P (Signed)
Craig Roth is an 35 y.o. male.   Chief Complaint: abdominal pain HPI: He has had pain like this off and on for the last 3 years, usually lasting less than 1 h and occuring weeks to months apart. In the last 3 months the pain has been more frequent and lasting longer. Currently, he has had this pain for 5 days. He also has been nauseated and vomited this morning. He does not feel the pain is related to food or any type of food. Resting and bland diet tends to help the pain previously. He denies fevers and chills.  Past Medical History  Diagnosis Date  . Psoriatic arthritis [facility])     Past Surgical History  Procedure Laterality Date  . Anterior cruciate ligament repair Right   . Dental surgery      Family History  Problem Relation Age of Onset  . Cancer Maternal Aunt    Social History:  reports that he has been smoking.  He has never used smokeless tobacco. He reports that he drinks alcohol. He reports that he does not use illicit drugs.  Allergies: No Known Allergies  Medications Prior to Admission  Medication Sig Dispense Refill  . alum & mag hydroxide-simeth (MAALOX PLUS) 400-400-40 MG/5ML suspension Take 15 mLs by mouth every 6 (six) hours as needed for indigestion.    Marland Kitchen HUMIRA PEN 40 MG/0.8ML PNKT Inject 40 mg as directed every 14 (fourteen) days.    . Multiple Vitamin (MULTIVITAMIN WITH MINERALS) TABS Take 1 tablet by mouth every morning.    . Omega-3 Fatty Acids (FISH OIL) 500 MG CAPS Take 500 mg by mouth daily.    . pantoprazole (PROTONIX) 20 MG tablet Take 1 tablet (20 mg total) by mouth 2 (two) times daily before a meal. 60 tablet 0  . Probiotic Product (PROBIOTIC PO) Take 1 capsule by mouth daily.    . sodium-potassium bicarbonate (ALKA-SELTZER GOLD) TBEF dissolvable tablet Take 2 tablets by mouth 2 (two) times daily as needed (for indigestion/stomach pain.).       Results for orders placed or performed during the hospital encounter of 02/18/16 (from the past 48 hour(s))   CBC     Status: None   Collection Time: 02/18/16  3:30 PM  Result Value Ref Range   WBC 10.0 4.0 - 10.5 K/uL   RBC 4.98 4.22 - 5.81 MIL/uL   Hemoglobin 14.3 13.0 - 17.0 g/dL   HCT 42.7 39.0 - 52.0 %   MCV 85.7 78.0 - 100.0 fL   MCH 28.7 26.0 - 34.0 pg   MCHC 33.5 30.0 - 36.0 g/dL   RDW 13.9 11.5 - 15.5 %   Platelets 333 150 - 400 K/uL  Urinalysis, Routine w reflex microscopic (not at [facility])     Status: Abnormal   Collection Time: 02/18/16  3:40 PM  Result Value Ref Range   Color, Urine YELLOW YELLOW   APPearance CLEAR CLEAR   Specific Gravity, Urine 1.019 1.005 - 1.030   pH 6.5 5.0 - 8.0   Glucose, UA NEGATIVE NEGATIVE mg/dL   Hgb urine dipstick TRACE (A) NEGATIVE   Bilirubin Urine NEGATIVE NEGATIVE   Ketones, ur NEGATIVE NEGATIVE mg/dL   Protein, ur NEGATIVE NEGATIVE mg/dL   Nitrite NEGATIVE NEGATIVE   Leukocytes, UA NEGATIVE NEGATIVE  Urine microscopic-add on     Status: None   Collection Time: 02/18/16  3:40 PM  Result Value Ref Range   Squamous Epithelial / LPF NONE SEEN NONE SEEN   WBC, UA  0-5 0 - 5 WBC/hpf   RBC / HPF 0-5 0 - 5 RBC/hpf   Bacteria, UA NONE SEEN NONE SEEN   Urine-Other MUCOUS PRESENT   Comprehensive metabolic panel     Status: Abnormal   Collection Time: 02/18/16  4:56 PM  Result Value Ref Range   Sodium 145 135 - 145 mmol/L   Potassium 4.8 3.5 - 5.1 mmol/L   Chloride 110 101 - 111 mmol/L   CO2 26 22 - 32 mmol/L   Glucose, Bld 112 (H) 65 - 99 mg/dL   BUN 9 6 - 20 mg/dL   Creatinine, Ser 0.94 0.61 - 1.24 mg/dL   Calcium 9.2 8.9 - 10.3 mg/dL   Total Protein 7.3 6.5 - 8.1 g/dL   Albumin 4.1 3.5 - 5.0 g/dL   AST 295 (H) 15 - 41 U/L   ALT 234 (H) 17 - 63 U/L   Alkaline Phosphatase 93 38 - 126 U/L   Total Bilirubin 1.1 0.3 - 1.2 mg/dL   GFR calc non Af Amer >60 >60 mL/min   GFR calc Af Amer >60 >60 mL/min    Comment: (NOTE) The eGFR has been calculated using the CKD EPI equation. This calculation has not been validated in all clinical  situations. eGFR's persistently <60 mL/min signify possible Chronic Kidney Disease.    Anion gap 9 5 - 15  Lipase, blood     Status: None   Collection Time: 02/18/16  4:56 PM  Result Value Ref Range   Lipase 27 11 - 51 U/L   Ct Abdomen Pelvis W Contrast  02/18/2016  CLINICAL DATA:  Umbilical pain 5 days with nausea, vomiting and diarrhea. EXAM: CT ABDOMEN AND PELVIS WITH CONTRAST TECHNIQUE: Multidetector CT imaging of the abdomen and pelvis was performed using the standard protocol following bolus administration of intravenous contrast. CONTRAST:  56m OMNIPAQUE IOHEXOL 300 MG/ML SOLN, 1040mISOVUE-300 IOPAMIDOL (ISOVUE-300) INJECTION 61% COMPARISON:  None. FINDINGS: Lung bases demonstrate minimal dependent bibasilar atelectasis. 3 mm nodule over the right middle lobe. Abdominal images demonstrate mild distention of the gallbladder. There is dilatation of the common bowel duct during 1.5 cm with a 7-8 mm stone over the distal common bowel duct at the head of the pancreas. Subtle prominence of the central intrahepatic ducts. The spleen, pancreas and adrenal glands are normal. Stomach is normal. Kidneys are normal in size without hydronephrosis or nephrolithiasis. Ureters are normal. Vascular structures are within normal. Appendix is normal.  Colon small bowel are within normal. Pelvic images demonstrate the bladder, prostate and rectum to be within normal. The remaining bones and soft tissues are within normal. IMPRESSION: 7-8 mm stone over the distal common bile duct causing dilatation of the common bile duct measuring 1.5 cm and mild gallbladder distention. Electronically Signed   By: DaMarin Olp.D.   On: 02/18/2016 19:31    Review of Systems  Constitutional: Negative for fever and chills.  HENT: Negative for hearing loss.   Eyes: Negative for blurred vision and double vision.  Respiratory: Negative for cough and hemoptysis.   Cardiovascular: Negative for chest pain and palpitations.   Gastrointestinal: Positive for nausea, vomiting and abdominal pain.  Genitourinary: Negative for dysuria and urgency.  Musculoskeletal: Negative for myalgias and neck pain.  Skin: Negative for itching and rash.  Neurological: Negative for dizziness, tingling and headaches.  Endo/Heme/Allergies: Does not bruise/bleed easily.  Psychiatric/Behavioral: Negative for depression and suicidal ideas.    Blood pressure 147/97, pulse 86, temperature 98 F (36.7 C),  temperature source Oral, resp. rate 18, SpO2 97 %. Physical Exam  Constitutional: He is oriented to person, place, and time. He appears well-developed and well-nourished.  HENT:  Head: Normocephalic and atraumatic.  Eyes: Conjunctivae are normal. Pupils are equal, round, and reactive to light.  Neck: Normal range of motion. Neck supple.  Cardiovascular: Normal rate and regular rhythm.   Respiratory: Breath sounds normal.  GI: He exhibits no distension and no mass. There is tenderness in the epigastric area. There is no rebound and negative Murphy's sign.  Musculoskeletal: He exhibits no edema or tenderness.  Neurological: He is alert and oriented to person, place, and time.  Skin: Skin is warm and dry.  Psychiatric: He has a normal mood and affect. His behavior is normal.     Assessment/Plan 35 yo male with cholelithiasis and choledocholithiasis with bilirubin of 1.1 but 1.5cm duct on imagine -NPO -IVF -pain control -antibiotics -GI consult -recheck labs in am, if bili continues to rise may require ercp -should probably have gallbladder removed prior to discharge due to gallstone related complication  Mickeal Skinner, MD 02/18/2016, 10:18 PM

## 2016-02-18 NOTE — ED Notes (Signed)
PA at bedside to discuss plan of care

## 2016-02-18 NOTE — ED Notes (Addendum)
Pt reports mid/umbilical abdominal pain onset Monday with associated n/v/d. Pt reports evaluated for same 2 weeks ago; follow up appointment scheduled in May.

## 2016-02-19 ENCOUNTER — Encounter (HOSPITAL_COMMUNITY): Payer: Self-pay | Admitting: *Deleted

## 2016-02-19 LAB — COMPREHENSIVE METABOLIC PANEL
ALBUMIN: 4.2 g/dL (ref 3.5–5.0)
ALK PHOS: 125 U/L (ref 38–126)
ALT: 573 U/L — AB (ref 17–63)
AST: 449 U/L — ABNORMAL HIGH (ref 15–41)
Anion gap: 11 (ref 5–15)
BUN: 8 mg/dL (ref 6–20)
CALCIUM: 9.5 mg/dL (ref 8.9–10.3)
CO2: 25 mmol/L (ref 22–32)
Chloride: 105 mmol/L (ref 101–111)
Creatinine, Ser: 0.81 mg/dL (ref 0.61–1.24)
GFR calc Af Amer: >60 mL/min (ref >60)
GFR calc non Af Amer: >60 mL/min (ref >60)
Glucose, Bld: 122 mg/dL — ABNORMAL HIGH (ref 65–99)
Potassium: 3.9 mmol/L (ref 3.5–5.1)
Sodium: 141 mmol/L (ref 135–145)
Total Bilirubin: 3.6 mg/dL — ABNORMAL HIGH (ref 0.3–1.2)
Total Protein: 7.7 g/dL (ref 6.5–8.1)

## 2016-02-19 LAB — CBC
HCT: 40.6 % (ref 39.0–52.0)
HEMOGLOBIN: 13.7 g/dL (ref 13.0–17.0)
MCH: 29.7 pg (ref 26.0–34.0)
MCHC: 33.7 g/dL (ref 30.0–36.0)
MCV: 87.9 fL (ref 78.0–100.0)
PLATELETS: 316 10*3/uL (ref 150–400)
RBC: 4.62 MIL/uL (ref 4.22–5.81)
RDW: 13.8 % (ref 11.5–15.5)
WBC: 8.3 10*3/uL (ref 4.0–10.5)

## 2016-02-19 NOTE — Progress Notes (Signed)
  Progress Note: General Surgery Service   Subjective: Pain about the same, has been asking for medication through the night, no nausea or vomiting. We discussed the bilirubin increase and that GI will be in to discuss management regarding the apparent obstructing stone.  Objective: Vital signs in last 24 hours: Temp:  [97.8 F (36.6 C)-98.5 F (36.9 C)] 98.5 F (36.9 C) (04/02 0612) Pulse Rate:  [65-103] 90 (04/02 0612) Resp:  [14-18] 18 (04/02 0612) BP: (125-158)/(78-112) 140/84 mmHg (04/02 0612) SpO2:  [94 %-99 %] 94 % (04/02 0612) Weight:  [161.096[108.863 kg (240 lb)] 108.863 kg (240 lb) (04/02 0102) Last BM Date: 02/18/16  Intake/Output from previous day: 04/01 0701 - 04/02 0700 In: 916.3 [P.O.:200; I.V.:616.3; IV Piggyback:100] Out: 700 [Urine:700] Intake/Output this shift:    Lungs: CTAB  Cardiovascular: RRR  Abd: soft, tender in the epigastrium  Extremities: no edema  Neuro: AOx4  Lab Results: CBC   Recent Labs  02/18/16 1530 02/19/16 0419  WBC 10.0 8.3  HGB 14.3 13.7  HCT 42.7 40.6  PLT 333 316   BMET  Recent Labs  02/18/16 1656 02/19/16 0419  NA 145 141  K 4.8 3.9  CL 110 105  CO2 26 25  GLUCOSE 112* 122*  BUN 9 8  CREATININE 0.94 0.81  CALCIUM 9.2 9.5   PT/INR No results for input(s): LABPROT, INR in the last 72 hours. ABG No results for input(s): PHART, HCO3 in the last 72 hours.  Invalid input(s): PCO2, PO2  Studies/Results:  Anti-infectives: Anti-infectives    Start     Dose/Rate Route Frequency Ordered Stop   02/18/16 2145  piperacillin-tazobactam (ZOSYN) IVPB 3.375 g     3.375 g 12.5 mL/hr over 240 Minutes Intravenous 3 times per day 02/18/16 2138        Medications: Scheduled Meds: . enoxaparin (LOVENOX) injection  40 mg Subcutaneous QHS  . pantoprazole  20 mg Oral BID AC  . piperacillin-tazobactam (ZOSYN)  IV  3.375 g Intravenous 3 times per day   Continuous Infusions: . sodium chloride 75 mL/hr at 02/18/16 2147   PRN  Meds:.morphine injection, ondansetron **OR** ondansetron (ZOFRAN) IV, simethicone  Assessment/Plan: Patient Active Problem List   Diagnosis Date Noted  . Choledocholithiasis with acute cholecystitis 02/18/2016  . Cholelithiasis 02/18/2016   -increasing bilirubin- follow up GI recs -continue abx -continue NPO -out of bed -pain control   LOS: 1 day   Rodman PickleLuke Aaron Diane Hanel, MD Pg# [phone] [facility]

## 2016-02-19 NOTE — Consult Note (Signed)
[facility] Consultation Note  Referring Provider: Dr. Feliciana RossettiLuke Kinsinger ([facility]) Primary Care Physician:  No primary care provider on file.  Reason for Consultation:  choledocholithiasis  HPI: Craig CageJames Roth is a 35 y.o. male whom we've been asked to see for choledocholithiasis.  Patient has had intermittent epigastric abdominal pain for the past few years.  Over the past few months, and especially the past one week, the pain has worsened.  No visible jaundice up to time of admission.  Some nausea and vomiting.  No clear association of pain with eating.  No fevers, chills.  No weight loss.  Imaging studies shows ~ 10 mm bile duct stone with biliary dilatation; CBD dilatation dates at least back to mid-March, when he had ultrasound, and persists on CT done yesterday.  Pain today is better in comparison with yesterday.   Past Medical History  Diagnosis Date  . Psoriatic arthritis [facility](HCC)     Past Surgical History  Procedure Laterality Date  . Anterior cruciate ligament repair Right   . Dental surgery      Prior to Admission medications   Medication Sig Start Date End Date Taking? Authorizing Provider  alum & mag hydroxide-simeth (MAALOX PLUS) 400-400-40 MG/5ML suspension Take 15 mLs by mouth every 6 (six) hours as needed for indigestion.   Yes Historical Provider, MD  HUMIRA PEN 40 MG/0.8ML PNKT Inject 40 mg as directed every 14 (fourteen) days. 02/17/16  Yes Historical Provider, MD  Multiple Vitamin (MULTIVITAMIN WITH MINERALS) TABS Take 1 tablet by mouth every morning.   Yes Historical Provider, MD  Omega-3 Fatty Acids (FISH OIL) 500 MG CAPS Take 500 mg by mouth daily.   Yes Historical Provider, MD  pantoprazole (PROTONIX) 20 MG tablet Take 1 tablet (20 mg total) by mouth 2 (two) times daily before a meal. 01/30/16  Yes Raeford RazorStephen Kohut, MD  Probiotic Product (PROBIOTIC PO) Take 1 capsule by mouth daily.   Yes Historical Provider, MD  sodium-potassium bicarbonate (ALKA-SELTZER GOLD) TBEF  dissolvable tablet Take 2 tablets by mouth 2 (two) times daily as needed (for indigestion/stomach pain.).    Yes Historical Provider, MD    Current Facility-Administered Medications  Medication Dose Route Frequency Provider Last Rate Last Dose  . 0.9 %  sodium chloride infusion   Intravenous Continuous Rodman PickleLuke Aaron Kinsinger, MD 75 mL/hr at 02/18/16 2147    . enoxaparin (LOVENOX) injection 40 mg  40 mg Subcutaneous QHS Rodman PickleLuke Aaron Kinsinger, MD   40 mg at 02/18/16 2233  . morphine 2 MG/ML injection 2-4 mg  2-4 mg Intravenous Q2H PRN Rodman PickleLuke Aaron Kinsinger, MD   4 mg at 02/19/16 [id number]  . ondansetron (ZOFRAN-ODT) disintegrating tablet 4 mg  4 mg Oral Q6H PRN Rodman PickleLuke Aaron Kinsinger, MD       Or  . ondansetron [facility](ZOFRAN) injection 4 mg  4 mg Intravenous Q6H PRN De BlanchLuke Aaron Kinsinger, MD      . pantoprazole (PROTONIX) EC tablet 20 mg  20 mg Oral BID AC Rodman PickleLuke Aaron Kinsinger, MD   20 mg at 02/19/16 0807  . piperacillin-tazobactam (ZOSYN) IVPB 3.375 g  3.375 g Intravenous 3 times per day Rodman PickleLuke Aaron Kinsinger, MD   3.375 g at 02/19/16 0437  . simethicone (MYLICON) chewable tablet 40 mg  40 mg Oral Q6H PRN Rodman PickleLuke Aaron Kinsinger, MD        Allergies as of 02/18/2016  . (No Known Allergies)    Family History  Problem Relation Age of Onset  . Cancer Maternal Aunt  Social History   Social History  . Marital Status: Single    Spouse Name: N/A  . Number of Children: N/A  . Years of Education: N/A   Occupational History  . Not on file.   Social History Main Topics  . Smoking status: Current Some Day Smoker  . Smokeless tobacco: Never Used  . Alcohol Use: Yes     Comment: rarely  . Drug Use: No  . Sexual Activity: Not on file   Other Topics Concern  . Not on file   Social History Narrative    Review of Systems: ROS Dr. Sheliah Hatch 02/18/16 reviewed and I agree  Physical Exam: Vital signs in last 24 hours: Temp:  [97.8 F (36.6 C)-98.7 F (37.1 C)] 98.7 F (37.1 C) (04/02 1111) Pulse Rate:   [65-103] 94 (04/02 1111) Resp:  [14-18] 18 (04/02 1111) BP: (125-158)/(78-112) 146/96 mmHg (04/02 1111) SpO2:  [93 %-99 %] 93 % (04/02 1111) Weight:  [045.409 kg (240 lb)] 108.863 kg (240 lb) (04/02 0102) Last BM Date: 02/18/16 General:   Alert, overweight, Well-developed, well-nourished, pleasant and cooperative in NAD Head:  Normocephalic and atraumatic. Eyes:  Sclera mild icteric bilaterally,   Conjunctiva pink. Ears:  Normal auditory acuity. Nose:  No deformity, discharge,  or lesions. Mouth:  No deformity or lesions.  Oropharynx pink, slightly dry Neck:  Supple; no masses or thyromegaly. Lungs:  Clear throughout to auscultation.   No wheezes, crackles, or rhonchi. No acute distress. Heart:  Regular rate and rhythm; no murmurs, clicks, rubs,  or gallops. Abdomen:  Soft, mild epigastric tenderness without peritonitis. No masses, hepatosplenomegaly or hernias noted. Hypoactive bowel sounds, without guarding, and without rebound.     Msk:  Symmetrical without gross deformities. Normal posture. Pulses:  Normal pulses noted. Extremities:  Without clubbing or edema. Neurologic:  Alert and  oriented x4; grossly normal neurologically. Skin:  Intact without significant lesions or rashes. Psych:  Alert and cooperative. Normal mood and affect.   Lab Results:  Recent Labs  02/18/16 1530 02/19/16 0419  WBC 10.0 8.3  HGB 14.3 13.7  HCT 42.7 40.6  PLT 333 316   BMET  Recent Labs  02/18/16 1656 02/19/16 0419  NA 145 141  K 4.8 3.9  CL 110 105  CO2 26 25  GLUCOSE 112* 122*  BUN 9 8  CREATININE 0.94 0.81  CALCIUM 9.2 9.5   LFT  Recent Labs  02/19/16 0419  PROT 7.7  ALBUMIN 4.2  AST 449*  ALT 573*  ALKPHOS 125  BILITOT 3.6*   PT/INR No results for input(s): LABPROT, INR in the last 72 hours.  Studies/Results: Ct Abdomen Pelvis W Contrast  02/18/2016  CLINICAL DATA:  Umbilical pain 5 days with nausea, vomiting and diarrhea. EXAM: CT ABDOMEN AND PELVIS WITH CONTRAST  TECHNIQUE: Multidetector CT imaging of the abdomen and pelvis was performed using the standard protocol following bolus administration of intravenous contrast. CONTRAST:  25mL OMNIPAQUE IOHEXOL 300 MG/ML SOLN, ISOVUE-300 IOPAMIDOL (ISOVUE-300) INJECTION 61% COMPARISON:  None. FINDINGS: Lung bases demonstrate minimal dependent bibasilar atelectasis. 3 mm nodule over the right middle lobe. Abdominal images demonstrate mild distention of the gallbladder. There is dilatation of the common bowel duct during 1.5 cm with a 7-8 mm stone over the distal common bowel duct at the head of the pancreas. Subtle prominence of the central intrahepatic ducts. The spleen, pancreas and adrenal glands are normal. Stomach is normal. Kidneys are normal in size without hydronephrosis or nephrolithiasis. Ureters are normal.  Vascular structures are within normal. Appendix is normal.  Colon small bowel are within normal. Pelvic images demonstrate the bladder, prostate and rectum to be within normal. The remaining bones and soft tissues are within normal. IMPRESSION: 7-8 mm stone over the distal common bile duct causing dilatation of the common bile duct measuring 1.5 cm and mild gallbladder distention. Electronically Signed   By: Elberta Fortis M.D.   On: 02/18/2016 19:31   Impression:  1.  Obstructive jaundice from choledocholithiasis.  No evidence of cholangitis. 2.  Bile duct stone. 3.  Elevated LFTs, from #1/#2 above. 4.  Abdominal pain, ongoing and unchanged over the past one week, from #1 and #2 above.  Plan:  1.  Clear liquid diet. 2.  NPO after midnight. 3.  ERCP for biliary sphincterotomy and bile duct stone extraction tomorrow. 4.  Risks (up to and including bleeding, infection, perforation, pancreatitis that can be complicated by infected necrosis and death), benefits (removal of stones, alleviating blockage, decreasing risk of cholangitis or choledocholithiasis-related pancreatitis), and alternatives (watchful  waiting, percutaneous transhepatic cholangiography) of ERCP were explained to patient/family in detail and patient elects to proceed.   LOS: 1 day   Jaxan Michel M  02/19/2016, 12:02 PM  Pager [phone] If no answer or after 5 PM call [phone]

## 2016-02-20 ENCOUNTER — Encounter (HOSPITAL_COMMUNITY): Admission: EM | Disposition: A | Discharge status: Home / Self Care | Payer: Self-pay | Source: Home / Self Care

## 2016-02-20 ENCOUNTER — Inpatient Hospital Stay (HOSPITAL_COMMUNITY): Payer: 59 | Admitting: Certified Registered Nurse Anesthetist

## 2016-02-20 ENCOUNTER — Inpatient Hospital Stay (HOSPITAL_COMMUNITY): Payer: 59

## 2016-02-20 ENCOUNTER — Encounter (HOSPITAL_COMMUNITY): Payer: Self-pay

## 2016-02-20 HISTORY — PX: ERCP: SHX5425

## 2016-02-20 SURGERY — ERCP, WITH INTERVENTION IF INDICATED
Anesthesia: General

## 2016-02-20 MED ORDER — MIDAZOLAM HCL 2 MG/2ML IJ SOLN
INTRAMUSCULAR | Status: AC
  Filled 2016-02-20: qty 2

## 2016-02-20 MED ORDER — LIDOCAINE HCL (CARDIAC) 20 MG/ML IV SOLN
INTRAVENOUS | Status: AC
  Filled 2016-02-20: qty 5

## 2016-02-20 MED ORDER — LACTATED RINGERS IV SOLN
INTRAVENOUS | Status: DC
  Administered 2016-02-20: 09:00:00 via INTRAVENOUS
  Administered 2016-02-20: 1000 mL via INTRAVENOUS

## 2016-02-20 MED ORDER — GLUCAGON HCL RDNA (DIAGNOSTIC) 1 MG IJ SOLR
INTRAMUSCULAR | Status: AC
  Filled 2016-02-20: qty 1

## 2016-02-20 MED ORDER — SUCCINYLCHOLINE CHLORIDE 20 MG/ML IJ SOLN
INTRAMUSCULAR | Status: DC | PRN
  Administered 2016-02-20: 40 mg via INTRAVENOUS
  Administered 2016-02-20: 100 mg via INTRAVENOUS

## 2016-02-20 MED ORDER — PROPOFOL 10 MG/ML IV BOLUS
INTRAVENOUS | Status: AC
  Filled 2016-02-20: qty 20

## 2016-02-20 MED ORDER — SODIUM CHLORIDE 0.9 % IV SOLN: INTRAVENOUS | Status: DC

## 2016-02-20 MED ORDER — MIDAZOLAM HCL 5 MG/5ML IJ SOLN
INTRAMUSCULAR | Status: DC | PRN
  Administered 2016-02-20: 2 mg via INTRAVENOUS

## 2016-02-20 MED ORDER — LIDOCAINE HCL (CARDIAC) 20 MG/ML IV SOLN
INTRAVENOUS | Status: DC | PRN
  Administered 2016-02-20: 100 mg via INTRAVENOUS

## 2016-02-20 MED ORDER — SODIUM CHLORIDE 0.9 % IV SOLN
INTRAVENOUS | Status: DC | PRN
  Administered 2016-02-20: 50 mL

## 2016-02-20 MED ORDER — FENTANYL CITRATE (PF) 100 MCG/2ML IJ SOLN
INTRAMUSCULAR | Status: AC
  Filled 2016-02-20: qty 2

## 2016-02-20 MED ORDER — FENTANYL CITRATE (PF) 100 MCG/2ML IJ SOLN
INTRAMUSCULAR | Status: DC | PRN
  Administered 2016-02-20 (×2): 50 ug via INTRAVENOUS

## 2016-02-20 MED ORDER — PROPOFOL 10 MG/ML IV BOLUS
INTRAVENOUS | Status: DC | PRN
Start: 2016-02-20 — End: 2016-02-20
  Administered 2016-02-20: 200 mg via INTRAVENOUS

## 2016-02-20 MED ORDER — GLUCAGON HCL RDNA (DIAGNOSTIC) 1 MG IJ SOLR
INTRAMUSCULAR | Status: DC | PRN
  Administered 2016-02-20: 1 mg via INTRAVENOUS

## 2016-02-20 MED ORDER — ONDANSETRON HCL 4 MG/2ML IJ SOLN
INTRAMUSCULAR | Status: AC
  Filled 2016-02-20: qty 2

## 2016-02-20 NOTE — Anesthesia Preprocedure Evaluation (Signed)
Anesthesia Evaluation  Patient identified by MRN, date of birth, ID band Patient awake    Reviewed: Allergy & Precautions, NPO status , Patient's Chart, lab work & pertinent test results  History of Anesthesia Complications Negative for: history of anesthetic complications  Airway Mallampati: I  TM Distance: >3 FB Neck ROM: Full    Dental  (+) Teeth Intact   Pulmonary neg shortness of breath, neg COPD, neg recent URI, Current Smoker,    breath sounds clear to auscultation       Cardiovascular negative cardio ROS   Rhythm:Regular     Neuro/Psych negative neurological ROS  negative psych ROS   GI/Hepatic negative GI ROS, Neg liver ROS,   Endo/Other  Morbid obesity  Renal/GU negative Renal ROS     Musculoskeletal negative musculoskeletal ROS (+)   Abdominal   Peds  Hematology   Anesthesia Other Findings psoriasis   Reproductive/Obstetrics                             Anesthesia Physical Anesthesia Plan  ASA: II  Anesthesia Plan: General   Post-op Pain Management:    Induction: Intravenous  Airway Management Planned: Oral ETT  Additional Equipment: None  Intra-op Plan:   Post-operative Plan: Extubation in OR  Informed Consent: I have reviewed the patients History and Physical, chart, labs and discussed the procedure including the risks, benefits and alternatives for the proposed anesthesia with the patient or authorized representative who has indicated his/her understanding and acceptance.   Dental advisory given  Plan Discussed with: CRNA and Surgeon  Anesthesia Plan Comments:         Anesthesia Quick Evaluation

## 2016-02-20 NOTE — Progress Notes (Signed)
Patient ID: Craig Roth, male   DOB: May 28, 1981, 35 y.o.   MRN: [id number]     [facility]      [address] [place], [place] [address]    Phone: [phone] FAX: [phone]     Subjective: Sore.  No n/v.  C/o dark urine. VSS.  Afebrile.   Objective:  Vital signs:  Filed Vitals:   02/19/16 1431 02/19/16 1831 02/19/16 2150 02/20/16 0434  BP: 150/98 138/78 137/85 146/92  Pulse: 94 96 92 92  Temp: 98.5 F (36.9 C) 98.9 F (37.2 C) 98.8 F (37.1 C) 98.6 F (37 C)  TempSrc: Oral Oral Oral Oral  Resp: _0 Height:      Weight:      SpO2: 99% 92% 96% 93%    Last BM Date: 02/18/16  Intake/Output   Yesterday:  04/02 0701 - 04/03 0700 In: 2310 [P.O.:360; I.V.:1800; IV Piggyback:150] Out: 1800 [Urine:1800] This shift: I/O last 3 completed shifts: In: 3226.3 [P.O.:560; I.V.:2416.3; IV Piggyback:250] Out: 2500 [Urine:2500]    Physical Exam: General: Pt awake/alert/oriented x4 in no acute distress  Chest: cta.  No chest wall pain w good excursion CV:  Pulses intact.  Regular rhythm MS: Normal AROM mjr joints.  No obvious deformity Abdomen: Soft.  Nondistended.  Non tender.  No evidence of peritonitis.  No incarcerated hernias. Ext:  SCDs BLE.  No mjr edema.  No cyanosis Skin: No petechiae / purpura   Problem List:   Active Problems:   Choledocholithiasis with acute cholecystitis   Cholelithiasis    Results:   Labs: Results for orders placed or performed during the hospital encounter of 02/18/16 (from the past 48 hour(s))  CBC     Status: None   Collection Time: 02/18/16  3:30 PM  Result Value Ref Range   WBC 10.0 4.0 - 10.5 K/uL   RBC 4.98 4.22 - 5.81 MIL/uL   Hemoglobin 14.3 13.0 - 17.0 g/dL   HCT 42.7 39.0 - 52.0 %   MCV 85.7 78.0 - 100.0 fL   MCH 28.7 26.0 - 34.0 pg   MCHC 33.5 30.0 - 36.0 g/dL   RDW 13.9 11.5 - 15.5 %   Platelets 333 150 - 400 K/uL  Urinalysis, Routine w reflex microscopic  (not at [facility])     Status: Abnormal   Collection Time: 02/18/16  3:40 PM  Result Value Ref Range   Color, Urine YELLOW YELLOW   APPearance CLEAR CLEAR   Specific Gravity, Urine 1.019 1.005 - 1.030   pH 6.5 5.0 - 8.0   Glucose, UA NEGATIVE NEGATIVE mg/dL   Hgb urine dipstick TRACE (A) NEGATIVE   Bilirubin Urine NEGATIVE NEGATIVE   Ketones, ur NEGATIVE NEGATIVE mg/dL   Protein, ur NEGATIVE NEGATIVE mg/dL   Nitrite NEGATIVE NEGATIVE   Leukocytes, UA NEGATIVE NEGATIVE  Urine microscopic-add on     Status: None   Collection Time: 02/18/16  3:40 PM  Result Value Ref Range   Squamous Epithelial / LPF NONE SEEN NONE SEEN   WBC, UA 0-5 0 - 5 WBC/hpf   RBC / HPF 0-5 0 - 5 RBC/hpf   Bacteria, UA NONE SEEN NONE SEEN   Urine-Other MUCOUS PRESENT   Comprehensive metabolic panel     Status: Abnormal   Collection Time: 02/18/16  4:56 PM  Result Value Ref Range   Sodium 145 135 - 145 mmol/L   Potassium 4.8 3.5 - 5.1 mmol/L   Chloride  110 101 - 111 mmol/L   CO2 26 22 - 32 mmol/L   Glucose, Bld 112 (H) 65 - 99 mg/dL   BUN 9 6 - 20 mg/dL   Creatinine, Ser 0.94 0.61 - 1.24 mg/dL   Calcium 9.2 8.9 - 10.3 mg/dL   Total Protein 7.3 6.5 - 8.1 g/dL   Albumin 4.1 3.5 - 5.0 g/dL   AST 295 (H) 15 - 41 U/L   ALT 234 (H) 17 - 63 U/L   Alkaline Phosphatase 93 38 - 126 U/L   Total Bilirubin 1.1 0.3 - 1.2 mg/dL   GFR calc non Af Amer >60 >60 mL/min   GFR calc Af Amer >60 >60 mL/min    Comment: (NOTE) The eGFR has been calculated using the CKD EPI equation. This calculation has not been validated in all clinical situations. eGFR's persistently <60 mL/min signify possible Chronic Kidney Disease.    Anion gap 9 5 - 15  Lipase, blood     Status: None   Collection Time: 02/18/16  4:56 PM  Result Value Ref Range   Lipase 27 11 - 51 U/L  Comprehensive metabolic panel     Status: Abnormal   Collection Time: 02/19/16  4:19 AM  Result Value Ref Range   Sodium 141 135 - 145 mmol/L   Potassium 3.9 3.5 - 5.1  mmol/L    Comment: RESULT REPEATED AND VERIFIED DELTA CHECK NOTED    Chloride 105 101 - 111 mmol/L   CO2 25 22 - 32 mmol/L   Glucose, Bld 122 (H) 65 - 99 mg/dL   BUN 8 6 - 20 mg/dL   Creatinine, Ser 0.81 0.61 - 1.24 mg/dL   Calcium 9.5 8.9 - 10.3 mg/dL   Total Protein 7.7 6.5 - 8.1 g/dL   Albumin 4.2 3.5 - 5.0 g/dL   AST 449 (H) 15 - 41 U/L   ALT 573 (H) 17 - 63 U/L   Alkaline Phosphatase 125 38 - 126 U/L   Total Bilirubin 3.6 (H) 0.3 - 1.2 mg/dL   GFR calc non Af Amer >60 >60 mL/min   GFR calc Af Amer >60 >60 mL/min    Comment: (NOTE) The eGFR has been calculated using the CKD EPI equation. This calculation has not been validated in all clinical situations. eGFR's persistently <60 mL/min signify possible Chronic Kidney Disease.    Anion gap 11 5 - 15  CBC     Status: None   Collection Time: 02/19/16  4:19 AM  Result Value Ref Range   WBC 8.3 4.0 - 10.5 K/uL   RBC 4.62 4.22 - 5.81 MIL/uL   Hemoglobin 13.7 13.0 - 17.0 g/dL   HCT 40.6 39.0 - 52.0 %   MCV 87.9 78.0 - 100.0 fL   MCH 29.7 26.0 - 34.0 pg   MCHC 33.7 30.0 - 36.0 g/dL   RDW 13.8 11.5 - 15.5 %   Platelets 316 150 - 400 K/uL    Imaging / Studies: Ct Abdomen Pelvis W Contrast  02/18/2016  CLINICAL DATA:  Umbilical pain 5 days with nausea, vomiting and diarrhea. EXAM: CT ABDOMEN AND PELVIS WITH CONTRAST TECHNIQUE: Multidetector CT imaging of the abdomen and pelvis was performed using the standard protocol following bolus administration of intravenous contrast. CONTRAST:  65m OMNIPAQUE IOHEXOL 300 MG/ML SOLN, 105mISOVUE-300 IOPAMIDOL (ISOVUE-300) INJECTION 61% COMPARISON:  None. FINDINGS: Lung bases demonstrate minimal dependent bibasilar atelectasis. 3 mm nodule over the right middle lobe. Abdominal images demonstrate mild distention of the gallbladder.  There is dilatation of the common bowel duct during 1.5 cm with a 7-8 mm stone over the distal common bowel duct at the head of the pancreas. Subtle prominence of the  central intrahepatic ducts. The spleen, pancreas and adrenal glands are normal. Stomach is normal. Kidneys are normal in size without hydronephrosis or nephrolithiasis. Ureters are normal. Vascular structures are within normal. Appendix is normal.  Colon small bowel are within normal. Pelvic images demonstrate the bladder, prostate and rectum to be within normal. The remaining bones and soft tissues are within normal. IMPRESSION: 7-8 mm stone over the distal common bile duct causing dilatation of the common bile duct measuring 1.5 cm and mild gallbladder distention. Electronically Signed   By: Marin Olp M.D.   On: 02/18/2016 19:31    Medications / Allergies:  Scheduled Meds: . enoxaparin (LOVENOX) injection  40 mg Subcutaneous QHS  . pantoprazole  20 mg Oral BID AC  . piperacillin-tazobactam (ZOSYN)  IV  3.375 g Intravenous 3 times per day   Continuous Infusions: . sodium chloride 75 mL/hr at 02/19/16 1942   PRN Meds:.morphine injection, ondansetron **OR** ondansetron (ZOFRAN) IV, simethicone  Antibiotics: Anti-infectives    Start     Dose/Rate Route Frequency Ordered Stop   02/18/16 2145  piperacillin-tazobactam (ZOSYN) IVPB 3.375 g     3.375 g 12.5 mL/hr over 240 Minutes Intravenous 3 times per day 02/18/16 2138          Assessment/Plan Choledocholithiasis with acute cholecystitis  -ERCP today, then likely cholecystectomy in AM ID-zosyn FEN-NPO, clears after ERCP, IVF VTE prophylaxis-SCD/lovenox DIspo-ERCP  Erby Pian, ANP-BC [facility] Pager [phone](7A-4:30P)   02/20/2016 8:10 AM

## 2016-02-20 NOTE — Anesthesia Postprocedure Evaluation (Signed)
Anesthesia Post Note  Patient: Craig Roth  Procedure(s) Performed: Procedure(s) (LRB): ENDOSCOPIC RETROGRADE CHOLANGIOPANCREATOGRAPHY (ERCP) (N/A)  Patient location during evaluation: Endoscopy Anesthesia Type: General Level of consciousness: awake Pain management: pain level controlled Vital Signs Assessment: post-procedure vital signs reviewed and stable Respiratory status: spontaneous breathing and respiratory function stable Cardiovascular status: stable Postop Assessment: no signs of nausea or vomiting    Last Vitals:  Filed Vitals:   02/20/16 1230 02/20/16 1346  BP: 126/68 119/66  Pulse: 71 88  Temp: 37.2 C 36.8 C  Resp: 16 16    Last Pain:  Filed Vitals:   02/20/16 1347  PainSc: 5                  [place]

## 2016-02-20 NOTE — Op Note (Signed)
[facility] Patient Name: Craig Roth Procedure Date: 02/20/2016 MRN: [id number] Attending MD: Barrie Folk , MD Date of Birth: 23-Sep-1981 CSN:  Age: 35 Admit Type: Inpatient Procedure:                ERCP Indications:              Bile duct stone(s) Providers:                Everardo All. Madilyn Fireman, MD, Priscella Mann, RN, Arlee Muslim, Technician Referring MD:              Medicines:                Propofol per Anesthesia Complications:            No immediate complications. Estimated Blood Loss:     Estimated blood loss was minimal. Procedure:                Pre-Anesthesia Assessment:                           - Prior to the procedure, a History and Physical                            was performed, and patient medications and                            allergies were reviewed. The patient's tolerance of                            previous anesthesia was also reviewed. The risks                            and benefits of the procedure and the sedation                            options and risks were discussed with the patient.                            All questions were answered, and informed consent                            was obtained. Prior Anticoagulants: The patient has                            taken no previous anticoagulant or antiplatelet                            agents. ASA Grade Assessment: II - A patient with                            mild systemic disease. After reviewing the risks  and benefits, the patient was deemed in                            satisfactory condition to undergo the procedure.                           After obtaining informed consent, the scope was                            passed under direct vision. Throughout the                            procedure, the patient's blood pressure, pulse, and                            oxygen saturations were monitored continuously. The                            [id number]) scope was introduced through                            the mouth, and used to inject contrast into and                            used to inject contrast into the bile duct and                            ventral pancreatic duct. The ERCP was accomplished                            without difficulty. The patient tolerated the                            procedure well. Findings:      The major papilla was normal. The bile duct was deeply cannulated.       Contrast was injected. The lower third of the main bile duct contained       two stones, the largest of which was 6 mm in diameter. The main bile       duct was diffusely dilated, with an obstruction. The largest diameter       was 10 mm. A wire passed successfully into the entire main bile duct. A       5 mm biliary sphincterotomy was made with a traction (standard)       sphincterotome using blended current. The sphincterotomy oozed blood.       Choledocholithiasis was found in a nondilated duct. The biliary tree was       swept with a 15 mm balloon starting at the lower third of the main duct.       Sludge was swept from the duct. Two stones were removed. No stones       remained. Impression:               - The major papilla appeared normal.                           -  The entire main bile duct was dilated, with an                            obstruction.                           - Choledocholithiasis was found. Complete removal                            was accomplished by biliary sphincterotomy and                            balloon extraction.                           - A biliary sphincterotomy was performed.                           - The biliary tree was swept. Moderate Sedation:      N/A- Per Anesthesia Care Recommendation:           - Check liver enzymes (AST, ALT, alkaline                            phosphatase, bilirubin) in the morning. Procedure Code(s):        ---  Professional ---                           [phone], Endoscopic retrograde                            cholangiopancreatography (ERCP); with removal of                            calculi/debris from biliary/pancreatic duct(s)                           43262, Endoscopic retrograde                            cholangiopancreatography (ERCP); with                            sphincterotomy/papillotomy Diagnosis Code(s):        --- Professional ---                           K83.1, Obstruction of bile duct                           K80.50, Calculus of bile duct without cholangitis                            or cholecystitis without obstruction CPT copyright 2016 American Medical Association. All rights reserved. The codes documented in this report are preliminary and upon coder review may  be revised to meet current compliance requirements. Dorena CookeyJohn Colen Eltzroth, MD Barrie FolkJohn C Mccartney Chuba, MD 02/20/2016 10:30:39 AM This report has been signed  electronically. Number of Addenda: 0

## 2016-02-20 NOTE — Transfer of Care (Signed)
Immediate Anesthesia Transfer of Care Note  Patient: Craig Roth  Procedure(s) Performed: Procedure(s): ENDOSCOPIC RETROGRADE CHOLANGIOPANCREATOGRAPHY (ERCP) (N/A)  Patient Location: PACU  Anesthesia Type:General  Level of Consciousness:  sedated, patient cooperative and responds to stimulation  Airway & Oxygen Therapy:Patient Spontanous Breathing and Patient connected to face mask oxgen  Post-op Assessment:  Report given to PACU RN and Post -op Vital signs reviewed and stable  Post vital signs:  Reviewed and stable  Last Vitals:  Filed Vitals:   02/20/16 0839 02/20/16 1048  BP: 164/93 148/77  Pulse: 88 109  Temp: 37.2 C 37.1 C  Resp: 14 12    Complications: No apparent anesthesia complications

## 2016-02-20 NOTE — Anesthesia Procedure Notes (Signed)
Procedure Name: Intubation Date/Time: 02/20/2016 9:25 AM Performed by: Orest DikesPETERS, Bennet Kujawa J Pre-anesthesia Checklist: Patient identified, Emergency Drugs available, Suction available and Patient being monitored Patient Re-evaluated:Patient Re-evaluated prior to inductionOxygen Delivery Method: Circle System Utilized Preoxygenation: Pre-oxygenation with 100% oxygen Intubation Type: IV induction Ventilation: Mask ventilation without difficulty Laryngoscope Size: Mac and 4 Grade View: Grade I Tube type: Oral Tube size: 7.5 mm Number of attempts: 1 Airway Equipment and Method: Stylet Placement Confirmation: ETT inserted through vocal cords under direct vision,  positive ETCO2 and breath sounds checked- equal and bilateral Secured at: 21 cm Tube secured with: Tape Dental Injury: Teeth and Oropharynx as per pre-operative assessment

## 2016-02-21 ENCOUNTER — Inpatient Hospital Stay (HOSPITAL_COMMUNITY): Payer: 59 | Admitting: Anesthesiology

## 2016-02-21 ENCOUNTER — Encounter (HOSPITAL_COMMUNITY): Payer: Self-pay | Admitting: General Surgery

## 2016-02-21 ENCOUNTER — Encounter (HOSPITAL_COMMUNITY): Admission: EM | Disposition: A | Discharge status: Home / Self Care | Payer: Self-pay | Source: Home / Self Care

## 2016-02-21 ENCOUNTER — Inpatient Hospital Stay (HOSPITAL_COMMUNITY): Payer: 59

## 2016-02-21 HISTORY — PX: CHOLECYSTECTOMY: SHX55

## 2016-02-21 LAB — COMPREHENSIVE METABOLIC PANEL
ALBUMIN: 3.6 g/dL (ref 3.5–5.0)
ALK PHOS: 147 U/L — AB (ref 38–126)
ALT: 487 U/L — ABNORMAL HIGH (ref 17–63)
ALT: 403 U/L — AB (ref 17–63)
ANION GAP: 14 (ref 5–15)
ANION GAP: 11 (ref 5–15)
AST: 135 U/L — ABNORMAL HIGH (ref 15–41)
AST: 151 U/L — ABNORMAL HIGH (ref 15–41)
Albumin: 4.4 g/dL (ref 3.5–5.0)
Alkaline Phosphatase: 182 U/L — ABNORMAL HIGH (ref 38–126)
BUN: 8 mg/dL (ref 6–20)
BUN: 10 mg/dL (ref 6–20)
CALCIUM: 9.7 mg/dL (ref 8.9–10.3)
CALCIUM: 9.2 mg/dL (ref 8.9–10.3)
CHLORIDE: 104 mmol/L (ref 101–111)
CO2: 25 mmol/L (ref 22–32)
CO2: 25 mmol/L (ref 22–32)
CREATININE: 0.98 mg/dL (ref 0.61–1.24)
Chloride: 104 mmol/L (ref 101–111)
Creatinine, Ser: 0.9 mg/dL (ref 0.61–1.24)
GFR calc non Af Amer: >60 mL/min (ref >60)
GFR calc non Af Amer: >60 mL/min (ref >60)
GLUCOSE: 114 mg/dL — AB (ref 65–99)
Glucose, Bld: 106 mg/dL — ABNORMAL HIGH (ref 65–99)
POTASSIUM: 3.7 mmol/L (ref 3.5–5.1)
Potassium: 4.3 mmol/L (ref 3.5–5.1)
SODIUM: 143 mmol/L (ref 135–145)
SODIUM: 140 mmol/L (ref 135–145)
Total Bilirubin: 5.7 mg/dL — ABNORMAL HIGH (ref 0.3–1.2)
Total Bilirubin: 5 mg/dL — ABNORMAL HIGH (ref 0.3–1.2)
Total Protein: 8.4 g/dL — ABNORMAL HIGH (ref 6.5–8.1)
Total Protein: 7.1 g/dL (ref 6.5–8.1)

## 2016-02-21 LAB — CBC
HCT: 42.5 % (ref 39.0–52.0)
Hemoglobin: 14.1 g/dL (ref 13.0–17.0)
MCH: 29.6 pg (ref 26.0–34.0)
MCHC: 33.2 g/dL (ref 30.0–36.0)
MCV: 89.1 fL (ref 78.0–100.0)
PLATELETS: 313 10*3/uL (ref 150–400)
RBC: 4.77 MIL/uL (ref 4.22–5.81)
RDW: 14.1 % (ref 11.5–15.5)
WBC: 9.7 10*3/uL (ref 4.0–10.5)

## 2016-02-21 LAB — LIPASE, BLOOD: LIPASE: 74 U/L — AB (ref 11–51)

## 2016-02-21 LAB — SURGICAL PCR SCREEN
MRSA, PCR: NEGATIVE
Staphylococcus aureus: NEGATIVE

## 2016-02-21 SURGERY — LAPAROSCOPIC CHOLECYSTECTOMY WITH INTRAOPERATIVE CHOLANGIOGRAM
Anesthesia: General | Site: Abdomen

## 2016-02-21 MED ORDER — KCL IN DEXTROSE-NACL 20-5-0.9 MEQ/L-%-% IV SOLN
INTRAVENOUS | Status: DC
  Administered 2016-02-21 – 2016-02-23 (×4): via INTRAVENOUS
  Filled 2016-02-21 (×6): qty 1000

## 2016-02-21 MED ORDER — OXYCODONE HCL 5 MG PO TABS: 5.0000 mg | ORAL_TABLET | Freq: Once | ORAL | Status: DC | PRN

## 2016-02-21 MED ORDER — BUPIVACAINE HCL (PF) 0.5 % IJ SOLN
INTRAMUSCULAR | Status: AC
  Filled 2016-02-21: qty 30

## 2016-02-21 MED ORDER — HYDROMORPHONE HCL 1 MG/ML IJ SOLN
INTRAMUSCULAR | Status: AC
  Filled 2016-02-21: qty 1

## 2016-02-21 MED ORDER — FENTANYL CITRATE (PF) 100 MCG/2ML IJ SOLN
INTRAMUSCULAR | Status: DC | PRN
  Administered 2016-02-21 (×3): 50 ug via INTRAVENOUS
  Administered 2016-02-21: 100 ug via INTRAVENOUS

## 2016-02-21 MED ORDER — ROCURONIUM BROMIDE 100 MG/10ML IV SOLN
INTRAVENOUS | Status: DC | PRN
  Administered 2016-02-21 (×2): 10 mg via INTRAVENOUS
  Administered 2016-02-21: 40 mg via INTRAVENOUS
  Administered 2016-02-21: 10 mg via INTRAVENOUS

## 2016-02-21 MED ORDER — PROPOFOL 10 MG/ML IV BOLUS
INTRAVENOUS | Status: AC
  Filled 2016-02-21: qty 20

## 2016-02-21 MED ORDER — OXYCODONE HCL 5 MG/5ML PO SOLN
5.0000 mg | Freq: Once | ORAL | Status: DC | PRN
  Filled 2016-02-21: qty 5

## 2016-02-21 MED ORDER — PROPOFOL 10 MG/ML IV BOLUS
INTRAVENOUS | Status: DC | PRN
  Administered 2016-02-21: 200 mg via INTRAVENOUS

## 2016-02-21 MED ORDER — SUGAMMADEX SODIUM 200 MG/2ML IV SOLN
INTRAVENOUS | Status: DC | PRN
  Administered 2016-02-21: 300 mg via INTRAVENOUS

## 2016-02-21 MED ORDER — MIDAZOLAM HCL 5 MG/5ML IJ SOLN
INTRAMUSCULAR | Status: DC | PRN
  Administered 2016-02-21: 2 mg via INTRAVENOUS

## 2016-02-21 MED ORDER — DEXAMETHASONE SODIUM PHOSPHATE 10 MG/ML IJ SOLN
INTRAMUSCULAR | Status: AC
  Filled 2016-02-21: qty 1

## 2016-02-21 MED ORDER — ONDANSETRON HCL 4 MG/2ML IJ SOLN
INTRAMUSCULAR | Status: DC | PRN
  Administered 2016-02-21: 4 mg via INTRAVENOUS

## 2016-02-21 MED ORDER — KETOROLAC TROMETHAMINE 30 MG/ML IJ SOLN: 30.0000 mg | Freq: Once | INTRAMUSCULAR | Status: DC

## 2016-02-21 MED ORDER — SUCCINYLCHOLINE CHLORIDE 20 MG/ML IJ SOLN
INTRAMUSCULAR | Status: DC | PRN
  Administered 2016-02-21: 100 mg via INTRAVENOUS

## 2016-02-21 MED ORDER — HYDROMORPHONE HCL 1 MG/ML IJ SOLN
0.2500 mg | INTRAMUSCULAR | Status: DC | PRN
  Administered 2016-02-21 (×2): 0.5 mg via INTRAVENOUS

## 2016-02-21 MED ORDER — FENTANYL CITRATE (PF) 250 MCG/5ML IJ SOLN
INTRAMUSCULAR | Status: AC
  Filled 2016-02-21: qty 5

## 2016-02-21 MED ORDER — LIDOCAINE HCL (CARDIAC) 20 MG/ML IV SOLN
INTRAVENOUS | Status: DC | PRN
  Administered 2016-02-21: 100 mg via INTRAVENOUS

## 2016-02-21 MED ORDER — ONDANSETRON HCL 4 MG/2ML IJ SOLN
INTRAMUSCULAR | Status: AC
  Filled 2016-02-21: qty 2

## 2016-02-21 MED ORDER — IOPAMIDOL (ISOVUE-300) INJECTION 61%
INTRAVENOUS | Status: DC | PRN
  Administered 2016-02-21: 20 mL via INTRAVENOUS

## 2016-02-21 MED ORDER — LIDOCAINE HCL (CARDIAC) 20 MG/ML IV SOLN
INTRAVENOUS | Status: AC
  Filled 2016-02-21: qty 5

## 2016-02-21 MED ORDER — MIDAZOLAM HCL 2 MG/2ML IJ SOLN
INTRAMUSCULAR | Status: AC
  Filled 2016-02-21: qty 2

## 2016-02-21 MED ORDER — SUGAMMADEX SODIUM 500 MG/5ML IV SOLN
INTRAVENOUS | Status: AC
  Filled 2016-02-21: qty 5

## 2016-02-21 MED ORDER — BUPIVACAINE HCL 0.5 % IJ SOLN
INTRAMUSCULAR | Status: DC | PRN
  Administered 2016-02-21: 15 mL

## 2016-02-21 MED ORDER — LACTATED RINGERS IV SOLN
INTRAVENOUS | Status: DC | PRN
  Administered 2016-02-21 (×2): via INTRAVENOUS

## 2016-02-21 MED ORDER — DEXAMETHASONE SODIUM PHOSPHATE 10 MG/ML IJ SOLN
INTRAMUSCULAR | Status: DC | PRN
  Administered 2016-02-21: 10 mg via INTRAVENOUS

## 2016-02-21 SURGICAL SUPPLY — 49 items
APPLIER CLIP 5 13 M/L LIGAMAX5 (MISCELLANEOUS) ×3
BENZOIN TINCTURE PRP APPL 2/3 (GAUZE/BANDAGES/DRESSINGS) ×3 IMPLANT
CABLE HIGH FREQUENCY MONO STRZ (ELECTRODE) ×3 IMPLANT
CATH REDDICK CHOLANGI 4FR 50CM (CATHETERS) ×3 IMPLANT
CHLORAPREP W/TINT 26ML (MISCELLANEOUS) ×3 IMPLANT
CLIP APPLIE 5 13 M/L LIGAMAX5 (MISCELLANEOUS) ×1 IMPLANT
CLOSURE WOUND 1/2 X4 (GAUZE/BANDAGES/DRESSINGS) ×1
COVER MAYO STAND STRL (DRAPES) ×3 IMPLANT
COVER SURGICAL LIGHT HANDLE (MISCELLANEOUS) ×3 IMPLANT
CUTTER FLEX LINEAR 45M (STAPLE) ×3 IMPLANT
DECANTER SPIKE VIAL GLASS SM (MISCELLANEOUS) ×3 IMPLANT
DEVICE TROCAR PUNCTURE CLOSURE (ENDOMECHANICALS) ×3 IMPLANT
DISSECTOR BLUNT TIP ENDO 5MM (MISCELLANEOUS) ×3 IMPLANT
DRAIN CHANNEL 19F RND (DRAIN) ×3 IMPLANT
DRAPE C-ARM 42X120 X-RAY (DRAPES) ×3 IMPLANT
DRAPE LAPAROSCOPIC ABDOMINAL (DRAPES) ×3 IMPLANT
DRSG TEGADERM 2-3/8X2-3/4 SM (GAUZE/BANDAGES/DRESSINGS) ×9 IMPLANT
ELECT REM PT RETURN 9FT ADLT (ELECTROSURGICAL) ×3
ELECTRODE REM PT RTRN 9FT ADLT (ELECTROSURGICAL) ×1 IMPLANT
ENDOLOOP SUT PDS II  0 18 (SUTURE)
ENDOLOOP SUT PDS II 0 18 (SUTURE) IMPLANT
EVACUATOR SILICONE 100CC (DRAIN) ×3 IMPLANT
GAUZE SPONGE 2X2 8PLY STRL LF (GAUZE/BANDAGES/DRESSINGS) ×1 IMPLANT
GLOVE ECLIPSE 8.0 STRL XLNG CF (GLOVE) ×3 IMPLANT
GLOVE INDICATOR 8.0 STRL GRN (GLOVE) ×3 IMPLANT
GOWN STRL REUS W/TWL XL LVL3 (GOWN DISPOSABLE) ×9 IMPLANT
HEMOSTAT SNOW SURGICEL 2X4 (HEMOSTASIS) ×3 IMPLANT
IV CATH 14GX2 1/4 (CATHETERS) ×3 IMPLANT
KIT BASIN OR (CUSTOM PROCEDURE TRAY) ×3 IMPLANT
POSITIONER SURGICAL ARM (MISCELLANEOUS) IMPLANT
POUCH SPECIMEN RETRIEVAL 10MM (ENDOMECHANICALS) ×3 IMPLANT
RELOAD STAPLE TA45 3.5 REG BLU (ENDOMECHANICALS) ×3 IMPLANT
SCISSORS LAP 5X35 DISP (ENDOMECHANICALS) ×3 IMPLANT
SET IRRIG TUBING LAPAROSCOPIC (IRRIGATION / IRRIGATOR) ×3 IMPLANT
SLEEVE XCEL OPT CAN 5 100 (ENDOMECHANICALS) ×6 IMPLANT
SPONGE GAUZE 2X2 STER 10/PKG (GAUZE/BANDAGES/DRESSINGS) ×2
STRIP CLOSURE SKIN 1/2X4 (GAUZE/BANDAGES/DRESSINGS) ×2 IMPLANT
SUT ETHILON 2 0 PS N (SUTURE) ×3 IMPLANT
SUT MNCRL AB 4-0 PS2 18 (SUTURE) ×3 IMPLANT
SUT VICRYL 0 UR6 27IN ABS (SUTURE) ×3 IMPLANT
TAPE CLOTH 4X10 WHT NS (GAUZE/BANDAGES/DRESSINGS) IMPLANT
TOWEL OR 17X26 10 PK STRL BLUE (TOWEL DISPOSABLE) ×3 IMPLANT
TOWEL OR NON WOVEN STRL DISP B (DISPOSABLE) IMPLANT
TRAY LAPAROSCOPIC (CUSTOM PROCEDURE TRAY) ×3 IMPLANT
TROCAR BLADELESS OPT 5 100 (ENDOMECHANICALS) ×3 IMPLANT
TROCAR XCEL 12X100 BLDLESS (ENDOMECHANICALS) ×3 IMPLANT
TROCAR XCEL BLUNT TIP 100MML (ENDOMECHANICALS) ×3 IMPLANT
TROCAR XCEL NON-BLD 11X100MML (ENDOMECHANICALS) ×3 IMPLANT
TUBING INSUF HEATED (TUBING) ×3 IMPLANT

## 2016-02-21 NOTE — Transfer of Care (Signed)
Immediate Anesthesia Transfer of Care Note  Patient: Craig Roth  Procedure(s) Performed: Procedure(s): LAPAROSCOPIC CHOLECYSTECTOMY WITH INTRAOPERATIVE CHOLANGIOGRAM (N/A)  Patient Location: PACU  Anesthesia Type:General  Level of Consciousness: sedated  Airway & Oxygen Therapy: Patient Spontanous Breathing and Patient connected to face mask oxygen  Post-op Assessment: Report given to RN and Post -op Vital signs reviewed and stable  Post vital signs: Reviewed and stable  Last Vitals:  Filed Vitals:   02/21/16 1106 02/21/16 1122  BP: 120/69 146/83  Pulse: 82 89  Temp: 36.8 C 36.9 C  Resp: 18 18    Complications: No apparent anesthesia complications

## 2016-02-21 NOTE — Anesthesia Postprocedure Evaluation (Signed)
Anesthesia Post Note  Patient: Milford CageJames Knotts  Procedure(s) Performed: Procedure(s) (LRB): LAPAROSCOPIC CHOLECYSTECTOMY WITH INTRAOPERATIVE CHOLANGIOGRAM (N/A)  Patient location during evaluation: PACU Anesthesia Type: General Level of consciousness: awake and alert Pain management: pain level controlled Vital Signs Assessment: post-procedure vital signs reviewed and stable Respiratory status: spontaneous breathing, nonlabored ventilation, respiratory function stable and patient connected to nasal cannula oxygen Cardiovascular status: blood pressure returned to baseline and stable Postop Assessment: no signs of nausea or vomiting Anesthetic complications: no    Last Vitals:  Filed Vitals:   02/21/16 1430 02/21/16 1445  BP: 135/84 133/67  Pulse: 89 91  Temp: 36.8 C   Resp: 13 12    Last Pain:  Filed Vitals:   02/21/16 1447  PainSc: Asleep                 Ciclaly Mulcahey,W. EDMOND

## 2016-02-21 NOTE — Progress Notes (Signed)
[facility] Progress Note  Subjective: Patient complaining of no abdominal pain at all, complaining of generalized stiffness centered around his back. He states he felt immediate relief of his abdominal pain post procedure which has been maintained.  Objective: Vital signs in last 24 hours: Temp:  [98 F (36.7 C)-99.6 F (37.6 C)] 98.5 F (36.9 C) (04/03 2140) Pulse Rate:  [71-116] 97 (04/03 2140) Resp:  [12-18] 18 (04/03 2140) BP: (100-158)/(51-90) 120/76 mmHg (04/03 2140) SpO2:  [14 %-100 %] 94 % (04/03 2140) Weight change:    PE: Abdomen nontender  Lab Results: Results for orders placed or performed during the hospital encounter of 02/18/16 (from the past 24 hour(s))  Comprehensive metabolic panel     Status: Abnormal   Collection Time: 02/21/16  4:20 AM  Result Value Ref Range   Sodium 143 135 - 145 mmol/L   Potassium 3.7 3.5 - 5.1 mmol/L   Chloride 104 101 - 111 mmol/L   CO2 25 22 - 32 mmol/L   Glucose, Bld 106 (H) 65 - 99 mg/dL   BUN 8 6 - 20 mg/dL   Creatinine, Ser 4.09 0.61 - 1.24 mg/dL   Calcium 9.7 8.9 - 81.1 mg/dL   Total Protein 8.4 (H) 6.5 - 8.1 g/dL   Albumin 4.4 3.5 - 5.0 g/dL   AST 914 (H) 15 - 41 U/L   ALT 487 (H) 17 - 63 U/L   Alkaline Phosphatase 182 (H) 38 - 126 U/L   Total Bilirubin 5.7 (H) 0.3 - 1.2 mg/dL   GFR calc non Af Amer >60 >60 mL/min   GFR calc Af Amer >60 >60 mL/min   Anion gap 14 5 - 15  Lipase, blood     Status: Abnormal   Collection Time: 02/21/16  4:20 AM  Result Value Ref Range   Lipase 74 (H) 11 - 51 U/L  CBC     Status: None   Collection Time: 02/21/16  4:20 AM  Result Value Ref Range   WBC 9.7 4.0 - 10.5 K/uL   RBC 4.77 4.22 - 5.81 MIL/uL   Hemoglobin 14.1 13.0 - 17.0 g/dL   HCT 78.2 95.6 - 21.3 %   MCV 89.1 78.0 - 100.0 fL   MCH 29.6 26.0 - 34.0 pg   MCHC 33.2 30.0 - 36.0 g/dL   RDW 08.6 57.8 - 46.9 %   Platelets 313 150 - 400 K/uL    Studies/Results: Dg Ercp With Sphincterotomy  02/20/2016  CLINICAL  DATA:  Bile duct stones EXAM: ERCP TECHNIQUE: Multiple spot images obtained with the fluoroscopic device and submitted for interpretation post-procedure. FLUOROSCOPY TIME:  Radiation Exposure Index (as provided by the fluoroscopic device): 92.76 mGy If the device does not provide the exposure index: Fluoroscopy Time:  6 minutes and 57 seconds Number of Acquired Images:  10 COMPARISON:  None. FINDINGS: There are several filling defects in the common bile duct that were presumably removed. IMPRESSION: See above. These images were submitted for radiologic interpretation only. Please see the procedural report for the amount of contrast and the fluoroscopy time utilized. Electronically Signed   By: Jolaine Click M.D.   On: 02/20/2016 10:55      Assessment: Common bile duct stone status post ERCP with removal and sphincterotomy, LFTs up some today could be due to post-sphincterotomy edema. There was some bleeding that lasted for about 3 minutes after the stone was pulled through and there was poor drainage at the time. It is conceivable that he could  have some small amount of stone debris remaining but I doubt it would cause persistent obstruction once any edema resolved.  Plan: Will await lipase level, timing of cholecystectomy to be determined by general surgery.    Whit Bruni C 02/21/2016, 9:03 AM  Pager [phone] If no answer or after 5 PM call [phone]

## 2016-02-21 NOTE — Progress Notes (Signed)
Appreciate Dr. Madilyn FiremanHayes' input.  Lipase is minimally elevated and CBC is normal.  Will proceed with laparoscopic, possible open cholecystectomy with IOC today.  I have explained the procedure, risks, and aftercare of cholecystectomy.  Risks include but are not limited to bleeding, infection, wound problems, anesthesia, diarrhea, bile leak, injury to common bile duct/liver/intestine.  He seems to understand and agrees with the plan.

## 2016-02-21 NOTE — Op Note (Signed)
OPERATIVE NOTE-LAPAROSCOPIC CHOLECYSTECTOMY  Preoperative diagnosis:  Cholelithiasis, choledocholithiasis  Postoperative diagnosis:  Same with chronic cholecystitis  Procedure: Laparoscopic cholecystectomy with cholangiogram.  Surgeon: Avel Peaceodd Yarnell Arvidson, M.D.  Asst.:  Ovidio Kinavid Newman, M.D.  Anesthesia: General  Indication:   This is a 35 year old male who was admitted and found to have cholelithiasis and choledocholithiasis 02/18/16. He underwent ERCP with stone extraction yesterday. He now presents for cholecystectomy today. Films from the ERCP were reviewed.  Technique: He was brought to the operating room, placed supine on the operating table, and a general anesthetic was administered. The hair on the abdominal wall was clipped as was necessary. The abdominal wall was then sterilely prepped and draped. Local anesthetic (Marcaine) was infiltrated in the subumbilical region. A small subumbilical incision was made through the skin, subcutaneous tissue, fascia, and peritoneum entering the peritoneal cavity under direct vision. A pursestring suture of 0 Vicryl was placed around the edges of the fascia. A Hassan trocar was introduced into the peritoneal cavity and a pneumoperitoneum was created by insufflation of carbon dioxide gas. The laparoscope was introduced into the trocar and no underlying bleeding or organ injury was noted. He was then placed in the reverse Trendelenburg position with the right side tilted slightly up.  Three 5 mm trocars were then placed into the abdominal cavity under laparoscopic vision. One in the epigastric area, and 2 in the right upper quadrant area. The gallbladder was visualized and the fundus was grasped and retracted toward the right shoulder. Chronic inflammatory changes were noted. There were dense chronic inflammatory changes in the area of the infundibulum.  The infundibulum was mobilized with dissection close to the gallbladder and retracted laterally. An anterior  branch of the cystic artery was identified. A window was created around it. It was clipped and divided. The cystic duct was identified, it was significantly dilated. A window was created around it.  The critical view was achieved. A posterior branch of the cystic artery was identified, isolated, clipped and divided. This broadened the critical view.   A small incision was made in the cystic duct near the neck of the gallbladder. A cholangiocatheter was introduced through the anterior abdominal wall and placed in the cystic duct. A intraoperative cholangiogram was then performed.  Under real-time fluoroscopy, dilute contrast was injected into the cystic duct.  It was significantly dilated and there was low insertion of the cystic duct to the common hepatic duct to form the common bile duct with a meniscus sign and possible stone at that point. The right and left hepatic ducts as well as common hepatic duct filled.   The final report is pending the Radiologist's interpretation.  The cholangiocatheter was removed. The 5 mm epigastric trocar was upsized to a 12 mm trocar.  The dilated cystic duct was divided with the laparoscopic stapler. No bile leak was noted from the cystic duct stump.  Following this the gallbladder was dissected free from the liver using electrocautery. The gallbladder was then placed in a retrieval bag.   The gallbladder fossa was inspected, irrigated, and bleeding was controlled with electrocautery. Inspection showed that hemostasis was adequate and there was no evidence of bile leak. A piece of Surgicel was placed in the gallbladder fossa. The irrigation fluid was evacuated as much as possible. A 19 round Blake drain was then placed into the abdominal cavity through the epigastric trocar and brought out through the lateral right upper quadrant trocar site. It was anchored to the skin with nylon suture. It was  positioned in the gallbladder fossa.  The epigastric trocar was removed and  the fascial defect was closed using a 0 Vicryl suture. The subumbilical trocar was removed and the fascial defect closed by tightening and tying down the pursestring suture under laparoscopic vision.  The remaining trocars were removed and the pneumoperitoneum was released. The skin incisions were closed with 4-0 Monocryl subcuticular stitches. Steri-Strips and sterile dressings were applied.  The procedure was well-tolerated without any apparent complications. He was taken to the recovery room in satisfactory condition. Estimated blood loss was 150-200 cc. We'll discuss cholangiogram findings with Dr. Madilyn Fireman.

## 2016-02-21 NOTE — Anesthesia Preprocedure Evaluation (Addendum)
Anesthesia Evaluation  Patient identified by MRN, date of birth, ID band Patient awake    Reviewed: Allergy & Precautions, NPO status , Patient's Chart, lab work & pertinent test results  Airway Mallampati: II  TM Distance: >3 FB Neck ROM: Full    Dental  (+) Dental Advisory Given   Pulmonary Current Smoker,    breath sounds clear to auscultation       Cardiovascular negative cardio ROS   Rhythm:Regular Rate:Normal     Neuro/Psych negative neurological ROS     GI/Hepatic negative GI ROS, Cholecystitis    Endo/Other  negative endocrine ROS  Renal/GU negative Renal ROS     Musculoskeletal negative musculoskeletal ROS (+)   Abdominal   Peds  Hematology negative hematology ROS (+)   Anesthesia Other Findings   Reproductive/Obstetrics                            Lab Results  Component Value Date   WBC 9.7 02/21/2016   HGB 14.1 02/21/2016   HCT 42.5 02/21/2016   MCV 89.1 02/21/2016   PLT 313 02/21/2016   Lab Results  Component Value Date   CREATININE 0.98 02/21/2016   BUN 8 02/21/2016   NA 143 02/21/2016   K 3.7 02/21/2016   CL 104 02/21/2016   CO2 25 02/21/2016    Anesthesia Physical Anesthesia Plan  ASA: II  Anesthesia Plan: General   Post-op Pain Management:    Induction: Intravenous  Airway Management Planned: Oral ETT  Additional Equipment:   Intra-op Plan:   Post-operative Plan: Extubation in OR  Informed Consent: I have reviewed the patients History and Physical, chart, labs and discussed the procedure including the risks, benefits and alternatives for the proposed anesthesia with the patient or authorized representative who has indicated his/her understanding and acceptance.   Dental advisory given  Plan Discussed with: CRNA  Anesthesia Plan Comments:         Anesthesia Quick Evaluation

## 2016-02-21 NOTE — Anesthesia Procedure Notes (Signed)
Procedure Name: Intubation Date/Time: 02/21/2016 12:10 PM Performed by: Doran ClayALDAY, Rozetta Stumpp R Pre-anesthesia Checklist: Patient identified, Timeout performed, Emergency Drugs available, Suction available and Patient being monitored Patient Re-evaluated:Patient Re-evaluated prior to inductionOxygen Delivery Method: Circle system utilized Preoxygenation: Pre-oxygenation with 100% oxygen Intubation Type: IV induction Laryngoscope Size: Mac and 4 Grade View: Grade I Tube type: Oral Tube size: 7.5 mm Number of attempts: 1 Airway Equipment and Method: Stylet Placement Confirmation: ETT inserted through vocal cords under direct vision,  breath sounds checked- equal and bilateral and positive ETCO2 Secured at: 23 cm Tube secured with: Tape Dental Injury: Teeth and Oropharynx as per pre-operative assessment

## 2016-02-21 NOTE — Progress Notes (Signed)
1 Day Post-Op  Subjective: He reports developing some epigastric and back discomfort, "stiffness," overnight.  No nausea.  Objective: Vital signs in last 24 hours: Temp:  [98 F (36.7 C)-99.6 F (37.6 C)] 98.5 F (36.9 C) (04/03 2140) Pulse Rate:  [71-116] 97 (04/03 2140) Resp:  [12-18] 18 (04/03 2140) BP: (100-164)/(51-93) 120/76 mmHg (04/03 2140) SpO2:  [14 %-100 %] 94 % (04/03 2140) Weight:  [664.403 kg (240 lb)] 108.863 kg (240 lb) (04/03 0839) Last BM Date: 02/18/16  Intake/Output from previous day: 04/03 0701 - 04/04 0700 In: 3050 [P.O.:1200; I.V.:1800; IV Piggyback:50] Out: 3750 [Urine:3750] Intake/Output this shift:    PE: General- In NAD Abdomen-soft, tender in epigastrium  Lab Results:   Recent Labs  02/18/16 1530 02/19/16 0419  WBC 10.0 8.3  HGB 14.3 13.7  HCT 42.7 40.6  PLT 333 316   BMET  Recent Labs  02/19/16 0419 02/21/16 0420  NA 141 143  K 3.9 3.7  CL 105 104  CO2 25 25  GLUCOSE 122* 106*  BUN 8 8  CREATININE 0.81 0.98  CALCIUM 9.5 9.7   PT/INR No results for input(s): LABPROT, INR in the last 72 hours. Comprehensive Metabolic Panel:    Component Value Date/Time   NA 143 02/21/2016 0420   NA 141 02/19/2016 0419   K 3.7 02/21/2016 0420   K 3.9 02/19/2016 0419   CL 104 02/21/2016 0420   CL 105 02/19/2016 0419   CO2 25 02/21/2016 0420   CO2 25 02/19/2016 0419   BUN 8 02/21/2016 0420   BUN 8 02/19/2016 0419   CREATININE 0.98 02/21/2016 0420   CREATININE 0.81 02/19/2016 0419   GLUCOSE 106* 02/21/2016 0420   GLUCOSE 122* 02/19/2016 0419   CALCIUM 9.7 02/21/2016 0420   CALCIUM 9.5 02/19/2016 0419   AST 135* 02/21/2016 0420   AST 449* 02/19/2016 0419   ALT 487* 02/21/2016 0420   ALT 573* 02/19/2016 0419   ALKPHOS 182* 02/21/2016 0420   ALKPHOS 125 02/19/2016 0419   BILITOT 5.7* 02/21/2016 0420   BILITOT 3.6* 02/19/2016 0419   PROT 8.4* 02/21/2016 0420   PROT 7.7 02/19/2016 0419   ALBUMIN 4.4 02/21/2016 0420   ALBUMIN 4.2  02/19/2016 0419     Studies/Results: Dg Ercp With Sphincterotomy  02/20/2016  CLINICAL DATA:  Bile duct stones EXAM: ERCP TECHNIQUE: Multiple spot images obtained with the fluoroscopic device and submitted for interpretation post-procedure. FLUOROSCOPY TIME:  Radiation Exposure Index (as provided by the fluoroscopic device): 92.76 mGy If the device does not provide the exposure index: Fluoroscopy Time:  6 minutes and 57 seconds Number of Acquired Images:  10 COMPARISON:  None. FINDINGS: There are several filling defects in the common bile duct that were presumably removed. IMPRESSION: See above. These images were submitted for radiologic interpretation only. Please see the procedural report for the amount of contrast and the fluoroscopy time utilized. Electronically Signed   By: Marybelle Killings M.D.   On: 02/20/2016 10:55    Anti-infectives: Anti-infectives    Start     Dose/Rate Route Frequency Ordered Stop   02/18/16 2145  piperacillin-tazobactam (ZOSYN) IVPB 3.375 g     3.375 g 12.5 mL/hr over 240 Minutes Intravenous 3 times per day 02/18/16 2138        Assessment Active Problems:   Choledocholithiasis s/p ERCP and stone extraction yesterday-some epigastric discomfort this AM; T. Bili and alk phos up-may be due to swelling   Cholelithiasis    LOS: 3 days  Plan: Check lipase and CBC.  Ideally, would like to do cholecystectomy this admission.   Bekim Werntz J 02/21/2016

## 2016-02-21 NOTE — Progress Notes (Signed)
5w notified pt will be in room in 20 minutes 

## 2016-02-22 ENCOUNTER — Inpatient Hospital Stay (HOSPITAL_COMMUNITY): Payer: 59 | Admitting: Anesthesiology

## 2016-02-22 ENCOUNTER — Encounter (HOSPITAL_COMMUNITY): Admission: EM | Disposition: A | Discharge status: Home / Self Care | Payer: Self-pay | Source: Home / Self Care

## 2016-02-22 ENCOUNTER — Inpatient Hospital Stay (HOSPITAL_COMMUNITY): Payer: 59

## 2016-02-22 ENCOUNTER — Encounter (HOSPITAL_COMMUNITY): Payer: Self-pay

## 2016-02-22 HISTORY — PX: ERCP: SHX5425

## 2016-02-22 LAB — COMPREHENSIVE METABOLIC PANEL
ALBUMIN: 3.7 g/dL (ref 3.5–5.0)
ALT: 380 U/L — ABNORMAL HIGH (ref 17–63)
ANION GAP: 12 (ref 5–15)
AST: 107 U/L — AB (ref 15–41)
Alkaline Phosphatase: 153 U/L — ABNORMAL HIGH (ref 38–126)
BUN: 10 mg/dL (ref 6–20)
CHLORIDE: 106 mmol/L (ref 101–111)
CO2: 24 mmol/L (ref 22–32)
Calcium: 9.4 mg/dL (ref 8.9–10.3)
Creatinine, Ser: 0.81 mg/dL (ref 0.61–1.24)
GFR calc Af Amer: >60 mL/min (ref >60)
GFR calc non Af Amer: >60 mL/min (ref >60)
GLUCOSE: 136 mg/dL — AB (ref 65–99)
POTASSIUM: 4.1 mmol/L (ref 3.5–5.1)
SODIUM: 142 mmol/L (ref 135–145)
Total Bilirubin: 2.7 mg/dL — ABNORMAL HIGH (ref 0.3–1.2)
Total Protein: 7.4 g/dL (ref 6.5–8.1)

## 2016-02-22 LAB — CBC
HCT: 37.8 % — ABNORMAL LOW (ref 39.0–52.0)
Hemoglobin: 13 g/dL (ref 13.0–17.0)
MCH: 29.5 pg (ref 26.0–34.0)
MCHC: 34.4 g/dL (ref 30.0–36.0)
MCV: 85.9 fL (ref 78.0–100.0)
Platelets: 270 10*3/uL (ref 150–400)
RBC: 4.4 MIL/uL (ref 4.22–5.81)
RDW: 13.8 % (ref 11.5–15.5)
WBC: 14 10*3/uL — AB (ref 4.0–10.5)

## 2016-02-22 SURGERY — ERCP, WITH INTERVENTION IF INDICATED
Anesthesia: General

## 2016-02-22 MED ORDER — GLUCAGON HCL RDNA (DIAGNOSTIC) 1 MG IJ SOLR
INTRAMUSCULAR | Status: AC
  Filled 2016-02-22: qty 1

## 2016-02-22 MED ORDER — MIDAZOLAM HCL 2 MG/2ML IJ SOLN
INTRAMUSCULAR | Status: AC
  Filled 2016-02-22: qty 2

## 2016-02-22 MED ORDER — METHOCARBAMOL 500 MG PO TABS
1000.0000 mg | ORAL_TABLET | Freq: Three times a day (TID) | ORAL | Status: DC | PRN
  Filled 2016-02-22: qty 2

## 2016-02-22 MED ORDER — HYDROCODONE-ACETAMINOPHEN 5-325 MG PO TABS
1.0000 | ORAL_TABLET | Freq: Four times a day (QID) | ORAL | Status: DC | PRN
Start: 2016-02-22 — End: 2024-06-29

## 2016-02-22 MED ORDER — PROPOFOL 10 MG/ML IV BOLUS
INTRAVENOUS | Status: DC | PRN
  Administered 2016-02-22: 130 mg via INTRAVENOUS

## 2016-02-22 MED ORDER — FENTANYL CITRATE (PF) 100 MCG/2ML IJ SOLN
INTRAMUSCULAR | Status: AC
  Filled 2016-02-22: qty 2

## 2016-02-22 MED ORDER — LACTATED RINGERS IV SOLN
INTRAVENOUS | Status: DC
  Administered 2016-02-22: 09:00:00 via INTRAVENOUS

## 2016-02-22 MED ORDER — LIDOCAINE HCL (CARDIAC) 20 MG/ML IV SOLN
INTRAVENOUS | Status: DC | PRN
  Administered 2016-02-22: 100 mg via INTRAVENOUS

## 2016-02-22 MED ORDER — METHOCARBAMOL 500 MG PO TABS: 1000.0000 mg | ORAL_TABLET | Freq: Three times a day (TID) | ORAL | Status: DC | PRN

## 2016-02-22 MED ORDER — GLUCAGON HCL RDNA (DIAGNOSTIC) 1 MG IJ SOLR
INTRAMUSCULAR | Status: DC | PRN
  Administered 2016-02-22: 1 mg via INTRAVENOUS

## 2016-02-22 MED ORDER — FENTANYL CITRATE (PF) 100 MCG/2ML IJ SOLN: 25.0000 ug | INTRAMUSCULAR | Status: DC | PRN

## 2016-02-22 MED ORDER — PROPOFOL 10 MG/ML IV BOLUS
INTRAVENOUS | Status: AC
  Filled 2016-02-22: qty 20

## 2016-02-22 MED ORDER — LIDOCAINE HCL (CARDIAC) 20 MG/ML IV SOLN
INTRAVENOUS | Status: AC
  Filled 2016-02-22: qty 5

## 2016-02-22 MED ORDER — MEPERIDINE HCL 50 MG/ML IJ SOLN: 6.2500 mg | INTRAMUSCULAR | Status: DC | PRN

## 2016-02-22 MED ORDER — SUCCINYLCHOLINE CHLORIDE 20 MG/ML IJ SOLN
INTRAMUSCULAR | Status: DC | PRN
  Administered 2016-02-22: 100 mg via INTRAVENOUS

## 2016-02-22 MED ORDER — AMOXICILLIN-POT CLAVULANATE 875-125 MG PO TABS: 1.0000 | ORAL_TABLET | Freq: Two times a day (BID) | ORAL | Status: AC

## 2016-02-22 MED ORDER — DEXAMETHASONE SODIUM PHOSPHATE 10 MG/ML IJ SOLN
INTRAMUSCULAR | Status: DC | PRN
  Administered 2016-02-22: 10 mg via INTRAVENOUS

## 2016-02-22 MED ORDER — SODIUM CHLORIDE 0.9 % IV SOLN
INTRAVENOUS | Status: DC | PRN
  Administered 2016-02-22: 60 mL

## 2016-02-22 MED ORDER — LACTATED RINGERS IV SOLN
INTRAVENOUS | Status: DC | PRN
  Administered 2016-02-22 (×2): via INTRAVENOUS

## 2016-02-22 MED ORDER — FENTANYL CITRATE (PF) 100 MCG/2ML IJ SOLN
INTRAMUSCULAR | Status: DC | PRN
  Administered 2016-02-22: 25 ug via INTRAVENOUS
  Administered 2016-02-22: 75 ug via INTRAVENOUS

## 2016-02-22 MED ORDER — SODIUM CHLORIDE 0.9 % IV SOLN: INTRAVENOUS | Status: DC

## 2016-02-22 MED ORDER — MIDAZOLAM HCL 5 MG/5ML IJ SOLN
INTRAMUSCULAR | Status: DC | PRN
  Administered 2016-02-22: 2 mg via INTRAVENOUS

## 2016-02-22 MED ORDER — ONDANSETRON HCL 4 MG/2ML IJ SOLN
INTRAMUSCULAR | Status: AC
  Filled 2016-02-22: qty 2

## 2016-02-22 MED ORDER — DEXAMETHASONE SODIUM PHOSPHATE 10 MG/ML IJ SOLN
INTRAMUSCULAR | Status: AC
  Filled 2016-02-22: qty 1

## 2016-02-22 MED ORDER — HYDROCODONE-ACETAMINOPHEN 5-325 MG PO TABS
1.0000 | ORAL_TABLET | ORAL | Status: DC | PRN
  Administered 2016-02-22 – 2016-02-23 (×4): 1 via ORAL
  Filled 2016-02-22 (×4): qty 1

## 2016-02-22 MED ORDER — ONDANSETRON HCL 4 MG/2ML IJ SOLN
4.0000 mg | Freq: Four times a day (QID) | INTRAMUSCULAR | Status: DC
  Administered 2016-02-22: 4 mg via INTRAVENOUS
  Filled 2016-02-22 (×3): qty 2

## 2016-02-22 MED ORDER — ONDANSETRON HCL 4 MG/2ML IJ SOLN
INTRAMUSCULAR | Status: DC | PRN
  Administered 2016-02-22: 4 mg via INTRAVENOUS

## 2016-02-22 NOTE — Progress Notes (Signed)
[facility] Progress Note  Subjective: The patient is doing well postoperatively with expected amount of incisional tenderness. No return of visceral abdominal pain  Objective: Vital signs in last 24 hours: Temp:  [97.5 F (36.4 C)-99.1 F (37.3 C)] 98.7 F (37.1 C) (04/05 0550) Pulse Rate:  [72-95] 90 (04/05 0550) Resp:  [12-22] 18 (04/04 2141) BP: (120-146)/(67-88) 140/75 mmHg (04/05 0550) SpO2:  [94 %-99 %] 94 % (04/04 2141) Weight change:    PE: Abdomen soft, moderately tender in epigastric  Lab Results: Results for orders placed or performed during the hospital encounter of 02/18/16 (from the past 24 hour(s))  Comprehensive metabolic panel     Status: Abnormal   Collection Time: 02/21/16  3:31 PM  Result Value Ref Range   Sodium 140 135 - 145 mmol/L   Potassium 4.3 3.5 - 5.1 mmol/L   Chloride 104 101 - 111 mmol/L   CO2 25 22 - 32 mmol/L   Glucose, Bld 114 (H) 65 - 99 mg/dL   BUN 10 6 - 20 mg/dL   Creatinine, Ser 4.090.90 0.61 - 1.24 mg/dL   Calcium 9.2 8.9 - 81.110.3 mg/dL   Total Protein 7.1 6.5 - 8.1 g/dL   Albumin 3.6 3.5 - 5.0 g/dL   AST 914151 (H) 15 - 41 U/L   ALT 403 (H) 17 - 63 U/L   Alkaline Phosphatase 147 (H) 38 - 126 U/L   Total Bilirubin 5.0 (H) 0.3 - 1.2 mg/dL   GFR calc non Af Amer >60 >60 mL/min   GFR calc Af Amer >60 >60 mL/min   Anion gap 11 5 - 15  CBC     Status: Abnormal   Collection Time: 02/22/16  4:12 AM  Result Value Ref Range   WBC 14.0 (H) 4.0 - 10.5 K/uL   RBC 4.40 4.22 - 5.81 MIL/uL   Hemoglobin 13.0 13.0 - 17.0 g/dL   HCT 78.237.8 (L) 95.639.0 - 21.352.0 %   MCV 85.9 78.0 - 100.0 fL   MCH 29.5 26.0 - 34.0 pg   MCHC 34.4 30.0 - 36.0 g/dL   RDW 08.613.8 57.811.5 - 46.915.5 %   Platelets 270 150 - 400 K/uL  Comprehensive metabolic panel     Status: Abnormal   Collection Time: 02/22/16  4:12 AM  Result Value Ref Range   Sodium 142 135 - 145 mmol/L   Potassium 4.1 3.5 - 5.1 mmol/L   Chloride 106 101 - 111 mmol/L   CO2 24 22 - 32 mmol/L   Glucose, Bld  136 (H) 65 - 99 mg/dL   BUN 10 6 - 20 mg/dL   Creatinine, Ser 6.290.81 0.61 - 1.24 mg/dL   Calcium 9.4 8.9 - 52.810.3 mg/dL   Total Protein 7.4 6.5 - 8.1 g/dL   Albumin 3.7 3.5 - 5.0 g/dL   AST 413107 (H) 15 - 41 U/L   ALT 380 (H) 17 - 63 U/L   Alkaline Phosphatase 153 (H) 38 - 126 U/L   Total Bilirubin 2.7 (H) 0.3 - 1.2 mg/dL   GFR calc non Af Amer >60 >60 mL/min   GFR calc Af Amer >60 >60 mL/min   Anion gap 12 5 - 15    Studies/Results: Dg Cholangiogram Operative  02/21/2016  CLINICAL DATA:  ruq pain,  Pt had a ercp 02-20-2016  .08 min fluoro EXAM: INTRAOPERATIVE CHOLANGIOGRAM TECHNIQUE: Cholangiographic images from the C-arm fluoroscopic device were submitted for interpretation post-operatively. Please see the procedural report for the amount of contrast and the  fluoroscopy time utilized. COMPARISON:  ERCP 02/20/2016 and previous FINDINGS: A series of fluoroscopic spot images submitted for intraoperative cholangiogram via cystic duct. The cystic duct is distended. There is a meniscus just proximal to a calcified stone. There is a low insertion of the cystic duct to the common hepatic duct. A small amount of contrast opacifies the proximal common hepatic duct. The distal cystic duct overlies the common duct on this single projection. Intrahepatic bile ducts are incompletely opacified, appearing mildly distended centrally. Incomplete opacification of the distal CBD with no evident contrast passage into the duodenum. IMPRESSION: 1. Obstructing stone near the confluence of the cystic and common hepatic ducts. Electronically Signed   By: Corlis Leak M.D.   On: 02/21/2016 13:49   Dg Ercp With Sphincterotomy  02/20/2016  CLINICAL DATA:  Bile duct stones EXAM: ERCP TECHNIQUE: Multiple spot images obtained with the fluoroscopic device and submitted for interpretation post-procedure. FLUOROSCOPY TIME:  Radiation Exposure Index (as provided by the fluoroscopic device): 92.76 mGy If the device does not provide the  exposure index: Fluoroscopy Time:  6 minutes and 57 seconds Number of Acquired Images:  10 COMPARISON:  None. FINDINGS: There are several filling defects in the common bile duct that were presumably removed. IMPRESSION: See above. These images were submitted for radiologic interpretation only. Please see the procedural report for the amount of contrast and the fluoroscopy time utilized. Electronically Signed   By: Jolaine Click M.D.   On: 02/20/2016 10:55      Assessment: Possible retained common bile duct stone status post ERCP of microscopic cholecystectomy.  Plan: Discussed options of watchful waiting, MRCP versus proceeding to ERCP and we have chosen the latter. Patient understands risks rationale alternatives. We'll proceed later this morning and if successful or negative hopefully potentially discharge later in  afternoon    Jake Goodson C 02/22/2016, 8:16 AM  Pager [phone] If no answer or after 5 PM call [phone]

## 2016-02-22 NOTE — Discharge Instructions (Signed)

## 2016-02-22 NOTE — Discharge Summary (Signed)
[facility] Discharge Summary   Patient ID: Craig CageJames Roth MRN: [id number] DOB/AGE: [birth year] 34 y.o.  Admit date: 02/18/2016 Discharge date: 02/23/2016  Admitting Diagnosis: Choledocholithiasis Cholelithiasis  Discharge Diagnosis Patient Active Problem List   Diagnosis Date Noted  . Choledocholithiasis with acute cholecystitis 02/18/2016  . Cholelithiasis 02/18/2016    Consultants Dr. Madilyn Roth - GI  Imaging: Dg Cholangiogram Operative  02/21/2016  CLINICAL DATA:  ruq pain,  Pt had a ercp 02-20-2016  .08 min fluoro EXAM: INTRAOPERATIVE CHOLANGIOGRAM TECHNIQUE: Cholangiographic images from the C-arm fluoroscopic device were submitted for interpretation post-operatively. Please see the procedural report for the amount of contrast and the fluoroscopy time utilized. COMPARISON:  ERCP 02/20/2016 and previous FINDINGS: A series of fluoroscopic spot images submitted for intraoperative cholangiogram via cystic duct. The cystic duct is distended. There is a meniscus just proximal to a calcified stone. There is a low insertion of the cystic duct to the common hepatic duct. A small amount of contrast opacifies the proximal common hepatic duct. The distal cystic duct overlies the common duct on this single projection. Intrahepatic bile ducts are incompletely opacified, appearing mildly distended centrally. Incomplete opacification of the distal CBD with no evident contrast passage into the duodenum. IMPRESSION: 1. Obstructing stone near the confluence of the cystic and common hepatic ducts. Electronically Signed   By: Craig Roth  Hassell M.D.   On: 02/21/2016 13:49   Dg Ercp Biliary & Pancreatic Ducts  02/22/2016  CLINICAL DATA:  ERCP with sphincterotomy and stent placement for common bile duct stone. EXAM: ERCP TECHNIQUE: Multiple spot images obtained with the fluoroscopic device and submitted for interpretation post-procedure. COMPARISON:  CT abdomen pelvis - 02/18/2016; ERCP -02/20/2016; intraoperative  cholangiogram during laparoscopic cholecystectomy - 02/21/2016 FINDINGS: Five spot intraoperative fluoroscopic images of the right upper abdominal quadrant during ERCP are provided for review. Initial image demonstrates an ERCP probe overlying the right upper abdominal quadrant. There is selective cannulation and opacification of the CBD. Surgical clips overlie the expected location of the gallbladder fossa. A surgical drain is noted overlying the right upper abdominal quadrant. There are several apparent nonocclusive filling defects within the distal aspect of the CBD. There is minimal opacification of the central aspect of the intrahepatic biliary tree which appears mildly dilated. There is no definitive opacification of the residual cystic duct. There is no definitive opacification of the pancreatic duct. Subsequent images demonstrate apparent apparent biliary sweeping and presumed sphincterotomy. IMPRESSION: ERCP with findings worrisome for choledocholithiasis with subsequent presumed biliary sweeping and sphincterotomy. These images were submitted for radiologic interpretation only. Please see the procedural report for the amount of contrast and the fluoroscopy time utilized. Electronically Signed   By: Craig Roth  Watts M.D.   On: 02/22/2016 12:22    Procedures Dr. Abbey Chattersosenbower (02/21/16) - Laparoscopic Cholecystectomy with IOC Dr. Madilyn Roth (02/22/16) - ERCP with balloon sweep and sphincterotomy Dr. Madilyn Roth (02/20/16) - ERCP with 7cm 10 French stent was placed   Hospital Course:  35 y/o white male who [facility] with on and off abdominal pain for the last 3 years, usually lasting less than 1h and occuring weeks to months apart. In the last 3 months the pain has been more frequent and lasting longer. Currently, he has had this pain for 5 days. He also has been nauseated and vomited this morning. He does not feel the pain is related to food or any type of food. Resting and bland diet tends to help the pain  previously. He denies fevers and chills.  Workup showed choledocholithiasis and cholelithiasis.  Patient was admitted and underwent procedure listed above.  After his cholecystectomy he was found to have a retained stone even after pre-op ERCP and thus was taken back for a repeat ERCP to remove the retained stone; however, unfortunately the stone was too large to remove.  A stent was placed and he will follow up with Dr. Madilyn Fireman as an outpatient in a couple weeks.  Tolerated procedure well and was transferred to the floor.  LFT's have trended down, but WBC has remained mildly elevated thus he will be discharged home with 5 additional days of Augmentin.  Diet was advanced as tolerated.  On HD #5, the patient was voiding well, tolerating diet, ambulating well, pain well controlled, vital signs stable, incisions c/d/i and felt stable for discharge home.  The drain will remain until the post operation check, nursing will teach drain management before discharge.  Patient will follow up in our office in 1 week for drain removal and in 2 weeks and knows to call with questions or concerns.  PE: Gen: Alert, NAD, pleasant Abd: Soft, mild distension, appropriate tenderness, +BS, no HSM, incisions C/D/I, drain with serosanguinous drainage Ext: Right arm is swollen from upper arm to finger tips, and his bursa sac at the elbow is full. His arm is non-tender.  Sensation intact. Left arm normal.      Medication List    TAKE these medications        alum & mag hydroxide-simeth 400-400-40 MG/5ML suspension  Commonly known as:  MAALOX PLUS  Take 15 mLs by mouth every 6 (six) hours as needed for indigestion.     amoxicillin-clavulanate 875-125 MG tablet  Commonly known as:  AUGMENTIN  Take 1 tablet by mouth 2 (two) times daily.     Fish Oil 500 MG Caps  Take 500 mg by mouth daily.     HUMIRA PEN 40 MG/0.8ML Pnkt  Generic drug:  Adalimumab  Inject 40 mg as directed every 14 (fourteen) days.      HYDROcodone-acetaminophen 5-325 MG tablet  Commonly known as:  NORCO/VICODIN  Take 1-2 tablets by mouth every 6 (six) hours as needed for moderate pain or severe pain.     methocarbamol 500 MG tablet  Commonly known as:  ROBAXIN  Take 2 tablets (1,000 mg total) by mouth every 8 (eight) hours as needed for muscle spasms.     multivitamin with minerals Tabs tablet  Take 1 tablet by mouth every morning.     pantoprazole 20 MG tablet  Commonly known as:  PROTONIX  Take 1 tablet (20 mg total) by mouth 2 (two) times daily before a meal.     PROBIOTIC PO  Take 1 capsule by mouth daily.     sodium-potassium bicarbonate Tbef dissolvable tablet  Commonly known as:  ALKA-SELTZER GOLD  Take 2 tablets by mouth 2 (two) times daily as needed (for indigestion/stomach pain.).         Follow-up Information    Follow up with [facility]. Go on 03/06/2016.   Specialty:  General Surgery   Why:  For post-operation check.  Your appointment is at 1:30pm, please arrive at least before your appointment to complete your check in paperwork.  If you are unable to arrive prior to your appointment time we may have to cancel or reschedule you.   Contact information:   [address] [place] [address] [phone]  Follow up with Craig Roth,JOHN C, MD. Schedule an appointment as soon as possible for a visit in 2 weeks.   Specialty:  Gastroenterology   Why:  For post-hospital follow up with the GI doctor Dr. Madilyn Fireman.  Please call his office to arrange a follow up appointment   Contact information:   [address] [place] [address] [phone]       Follow up with Adolph Pollack, MD On 02/28/2016.   Specialty:  General Surgery   Why:  For post-operation check/consideration of drain removal at 2:15pm, please arrive by 1:45pm to check in and fill out paperwork.     Contact information:   [address] [place]  [place] [address] [phone]       Signed: Nonie Hoyer, [facility] [phone]  02/23/2016, 8:57 AM

## 2016-02-22 NOTE — Transfer of Care (Signed)
Immediate Anesthesia Transfer of Care Note  Patient: Craig Roth  Procedure(s) Performed: Procedure(s): ENDOSCOPIC RETROGRADE CHOLANGIOPANCREATOGRAPHY (ERCP) (N/A)  Patient Location: PACU  Anesthesia Type:General  Level of Consciousness:  sedated, patient cooperative and responds to stimulation  Airway & Oxygen Therapy:Patient Spontanous Breathing and Patient connected to face mask oxgen  Post-op Assessment:  Report given to PACU RN and Post -op Vital signs reviewed and stable  Post vital signs:  Reviewed and stable  Last Vitals:  Filed Vitals:   02/22/16 0550 02/22/16 0914  BP: 140/75 148/78  Pulse: 90 97  Temp: 37.1 C 36.7 C  Resp:  16    Complications: No apparent anesthesia complications

## 2016-02-22 NOTE — Progress Notes (Signed)
Supplied pt with home care drain information sheet. Went over home care drain with pt and wife.  Answered all pt's questions pertaining to drain.  Encouraged pt to empty drain next time it needs to be emptied.  Will continue to monitor.  Craig Roth, Craig Roth

## 2016-02-22 NOTE — Anesthesia Preprocedure Evaluation (Signed)
Anesthesia Evaluation  Patient identified by MRN, date of birth, ID band Patient awake    Reviewed: Allergy & Precautions, NPO status , Patient's Chart, lab work & pertinent test results  Airway Mallampati: II  TM Distance: >3 FB Neck ROM: Full    Dental  (+) Dental Advisory Given   Pulmonary Current Smoker,    breath sounds clear to auscultation       Cardiovascular negative cardio ROS   Rhythm:Regular Rate:Normal     Neuro/Psych negative neurological ROS     GI/Hepatic negative GI ROS, Cholecystitis    Endo/Other  negative endocrine ROS  Renal/GU negative Renal ROS     Musculoskeletal negative musculoskeletal ROS (+)   Abdominal   Peds  Hematology negative hematology ROS (+) 13/37   Anesthesia Other Findings For repeat ERCP  Reproductive/Obstetrics                             Anesthesia Physical Anesthesia Plan  ASA: II  Anesthesia Plan: General   Post-op Pain Management:    Induction: Intravenous  Airway Management Planned: Oral ETT  Additional Equipment:   Intra-op Plan:   Post-operative Plan:   Informed Consent: I have reviewed the patients History and Physical, chart, labs and discussed the procedure including the risks, benefits and alternatives for the proposed anesthesia with the patient or authorized representative who has indicated his/her understanding and acceptance.     Plan Discussed with:   Anesthesia Plan Comments:         Anesthesia Quick Evaluation

## 2016-02-22 NOTE — Progress Notes (Signed)
[facility] Progress Note  1 Day Post-Op  Subjective: Pt doing okay, very sore at incision sites.  He says the pain from before surgery is much better.  He c/o muscle spasms in his epigastrium.  He's ambulated some OOB.  No N/V.  NPO for possible repeat ERCP.  Objective: Vital signs in last 24 hours: Temp:  [97.5 F (36.4 C)-99.1 F (37.3 C)] 98.7 F (37.1 C) (04/05 0550) Pulse Rate:  [72-95] 90 (04/05 0550) Resp:  [12-22] 18 (04/04 2141) BP: (120-146)/(67-88) 140/75 mmHg (04/05 0550) SpO2:  [94 %-99 %] 94 % (04/04 2141) Last BM Date: 02/20/16  Intake/Output from previous day: 04/04 0701 - 04/05 0700 In: 2388.3 [I.V.:2388.3] Out: 2010 [Urine:1600; Drains:210; Blood:200] Intake/Output this shift:    PE: Gen:  Alert, NAD, pleasant Abd: Soft, mild distension, appropriate tenderness, +BS, no HSM, incisions C/D/I, drain with serosanguinous drainage Ext:  Right arm is greatly swollen from upper arm to finger tips, and his bursa sac at the elbow is full.  His arm is non-tender (he didn't even know it was swollen.  Sensation intact.  Left arm normal.     Lab Results:   Recent Labs  02/21/16 0420 02/22/16 0412  WBC 9.7 14.0*  HGB 14.1 13.0  HCT 42.5 37.8*  PLT 313 270   BMET  Recent Labs  02/21/16 1531 02/22/16 0412  NA 140 142  K 4.3 4.1  CL 104 106  CO2 25 24  GLUCOSE 114* 136*  BUN 10 10  CREATININE 0.90 0.81  CALCIUM 9.2 9.4   PT/INR No results for input(s): LABPROT, INR in the last 72 hours. CMP     Component Value Date/Time   NA 142 02/22/2016 0412   K 4.1 02/22/2016 0412   CL 106 02/22/2016 0412   CO2 24 02/22/2016 0412   GLUCOSE 136* 02/22/2016 0412   BUN 10 02/22/2016 0412   CREATININE 0.81 02/22/2016 0412   CALCIUM 9.4 02/22/2016 0412   PROT 7.4 02/22/2016 0412   ALBUMIN 3.7 02/22/2016 0412   AST 107* 02/22/2016 0412   ALT 380* 02/22/2016 0412   ALKPHOS 153* 02/22/2016 0412   BILITOT 2.7* 02/22/2016 0412   GFRNONAA >60  02/22/2016 0412   GFRAA >60 02/22/2016 0412   Lipase     Component Value Date/Time   LIPASE 74* 02/21/2016 0420       Studies/Results: Dg Cholangiogram Operative  02/21/2016  CLINICAL DATA:  ruq pain,  Pt had a ercp 02-20-2016  .08 min fluoro EXAM: INTRAOPERATIVE CHOLANGIOGRAM TECHNIQUE: Cholangiographic images from the C-arm fluoroscopic device were submitted for interpretation post-operatively. Please see the procedural report for the amount of contrast and the fluoroscopy time utilized. COMPARISON:  ERCP 02/20/2016 and previous FINDINGS: A series of fluoroscopic spot images submitted for intraoperative cholangiogram via cystic duct. The cystic duct is distended. There is a meniscus just proximal to a calcified stone. There is a low insertion of the cystic duct to the common hepatic duct. A small amount of contrast opacifies the proximal common hepatic duct. The distal cystic duct overlies the common duct on this single projection. Intrahepatic bile ducts are incompletely opacified, appearing mildly distended centrally. Incomplete opacification of the distal CBD with no evident contrast passage into the duodenum. IMPRESSION: 1. Obstructing stone near the confluence of the cystic and common hepatic ducts. Electronically Signed   By: Lucrezia Europe M.D.   On: 02/21/2016 13:49   Dg Ercp With Sphincterotomy  02/20/2016  CLINICAL DATA:  Bile duct stones  EXAM: ERCP TECHNIQUE: Multiple spot images obtained with the fluoroscopic device and submitted for interpretation post-procedure. FLUOROSCOPY TIME:  Radiation Exposure Index (as provided by the fluoroscopic device): 92.76 mGy If the device does not provide the exposure index: Fluoroscopy Time:  6 minutes and 57 seconds Number of Acquired Images:  10 COMPARISON:  None. FINDINGS: There are several filling defects in the common bile duct that were presumably removed. IMPRESSION: See above. These images were submitted for radiologic interpretation only. Please see  the procedural report for the amount of contrast and the fluoroscopy time utilized. Electronically Signed   By: Marybelle Killings M.D.   On: 02/20/2016 10:55    Anti-infectives: Anti-infectives    Start     Dose/Rate Route Frequency Ordered Stop   02/18/16 2145  piperacillin-tazobactam (ZOSYN) IVPB 3.375 g     3.375 g 12.5 mL/hr over 240 Minutes Intravenous 3 times per day 02/18/16 2138         Assessment/Plan Choledocholithiasis s/p ERCP and stone extraction yesterday Cholelithiasis - Laparoscopic cholecystectomy with IOC -IOC was positive for retained stone, WBC is 14.0, Bili is down to 2.7, ALT/AST/Alk Phos still up -GI re-consulted, may need repeat ERCP, Dr. Amedeo Plenty following -Ambulate and IS -SCD's and hold lovenox -Keep NPO in case of repeat procedure  Right arm IV infiltration with fluid filled bursa sac -Ice packs, elevation, ace wrap compression    LOS: 4 days    Craig Roth 02/22/2016, 7:23 AM Pager: [phone]  (7am - 4:30pm M-F; 7am - 11:30am Sa/Su)

## 2016-02-22 NOTE — Progress Notes (Signed)
Patient had a distal large common bile duct stone that I could not remove or crush with basket or balloon. A stent was placed and I will see him in follow-up. If he feels okay he probably be discharged late this afternoon.

## 2016-02-22 NOTE — Anesthesia Postprocedure Evaluation (Signed)
Anesthesia Post Note  Patient: Craig Roth  Procedure(s) Performed: Procedure(s) (LRB): ENDOSCOPIC RETROGRADE CHOLANGIOPANCREATOGRAPHY (ERCP) (N/A)  Patient location during evaluation: Endoscopy Anesthesia Type: General Level of consciousness: awake and alert Pain management: pain level controlled Vital Signs Assessment: post-procedure vital signs reviewed and stable Respiratory status: spontaneous breathing, nonlabored ventilation, respiratory function stable and patient connected to nasal cannula oxygen Cardiovascular status: blood pressure returned to baseline and stable Postop Assessment: no signs of nausea or vomiting Anesthetic complications: no    Last Vitals:  Filed Vitals:   02/22/16 1125 02/22/16 1130  BP:  164/81  Pulse: 113 107  Temp:    Resp: 15 16    Last Pain:  Filed Vitals:   02/22/16 1133  PainSc: 5                  Craig Roth

## 2016-02-22 NOTE — Op Note (Signed)
[facility] Patient Name: Craig CageJames Roth Procedure Date: 02/22/2016 MRN: [id number] Attending MD: Barrie FolkJohn C Claudette Wermuth , MD Date of Birth: Aug 21, 1981 CSN:  Age: [age] Admit Type: Inpatient Procedure:                ERCP Indications:              Suspected bile duct stone(s) Providers:                Everardo AllJohn C. Madilyn FiremanHayes, MD, Priscella MannAutumn Goldsmith, RN, Arlee Muslimhris                            Chandler, Technician Referring MD:              Medicines:                Propofol per Anesthesia Complications:            No immediate complications. Estimated Blood Loss:     Estimated blood loss: none. Procedure:                Pre-Anesthesia Assessment:                           - Prior to the procedure, a History and Physical                            was performed, and patient medications and                            allergies were reviewed. The patient's tolerance of                            previous anesthesia was also reviewed. The risks                            and benefits of the procedure and the sedation                            options and risks were discussed with the patient.                            All questions were answered, and informed consent                            was obtained. Prior Anticoagulants: The patient has                            taken no previous anticoagulant or antiplatelet                            agents. ASA Grade Assessment: II - A patient with                            mild systemic disease. After reviewing the risks  and benefits, the patient was deemed in                            satisfactory condition to undergo the procedure.                           After obtaining informed consent, the scope was                            passed under direct vision. Throughout the                            procedure, the patient's blood pressure, pulse, and                            oxygen saturations were monitored continuously.  The                            [id number] ([id number]) scope was introduced through                            the mouth, and used to inject contrast into and                            used to inject contrast into the bile duct. The                            ERCP was technically difficult and complex due to a                            large stone. The patient tolerated the procedure                            well. Scope In: Scope Out: Findings:      The bile duct was deeply cannulated. Contrast was injected. The common       bile duct contained filling defect(s) thought to be a stone. The main       bile duct was diffusely dilated. The largest diameter was 12 mm. A long       cystic duct stump. The biliary tree was otherwise normal. The biliary       sphincterotomy was extended to a total of 4 mm in length with a       sphincterotome using blended current. There was no post-sphincterotomy       bleeding. there was a large distal common duct stone which was hard and       would not pull through the ampulla despite at a 12-15 and 15-18       mmballoon sweeps. A 2 cm basket was used but could not entrap the stone.       Finally a 7 cm 10 French stent was placed with excellent drainage of       clean amber bile. Impression:               - A filling defect consistent with a stone was seen  on the cholangiogram.                           - The entire main bile duct was dilated.                           - The patient has had a cholecystectomy.                           - A biliary sphincterotomy was performed. Moderate Sedation:      no moderate sedation Recommendation:           - Observe patient's clinical course.                           - Return to my office in 2 weeks.                           - Will need repeat ERCP probably with                            cholangioscopy and stone lithotripsy. Procedure Code(s):        --- Professional ---                            [id number], Endoscopic retrograde                            cholangiopancreatography (ERCP); with                            sphincterotomy/papillotomy Diagnosis Code(s):        --- Professional ---                           Z90.49, Acquired absence of other specified parts                            of digestive tract                           K83.8, Other specified diseases of biliary tract                           R93.2, Abnormal findings on diagnostic imaging of                            liver and biliary tract CPT copyright 2016 American [employer] Association. All rights reserved. The codes documented in this report are preliminary and upon coder review may  be revised to meet current compliance requirements. Dorena Cookey, MD Barrie Folk, MD 02/22/2016 11:20:54 AM This report has been signed electronically. Number of Addenda: 0

## 2016-02-22 NOTE — Anesthesia Procedure Notes (Signed)
Procedure Name: Intubation Date/Time: 02/22/2016 9:38 AM Performed by: Doran ClayALDAY, Kareena Arrambide R Pre-anesthesia Checklist: Patient identified, Timeout performed, Emergency Drugs available, Suction available and Patient being monitored Patient Re-evaluated:Patient Re-evaluated prior to inductionOxygen Delivery Method: Circle system utilized Preoxygenation: Pre-oxygenation with 100% oxygen Intubation Type: IV induction Laryngoscope Size: Mac and 4 Grade View: Grade I Tube type: Oral Tube size: 7.5 mm Number of attempts: 1 Airway Equipment and Method: Stylet Placement Confirmation: ETT inserted through vocal cords under direct vision,  breath sounds checked- equal and bilateral and positive ETCO2 Secured at: 22 cm Tube secured with: Tape Dental Injury: Teeth and Oropharynx as per pre-operative assessment

## 2016-02-23 ENCOUNTER — Encounter (HOSPITAL_COMMUNITY): Payer: Self-pay | Admitting: Gastroenterology

## 2016-02-23 LAB — CBC
HCT: 36.2 % — ABNORMAL LOW (ref 39.0–52.0)
HEMATOCRIT: 33.2 % — AB (ref 39.0–52.0)
HEMOGLOBIN: 11.1 g/dL — AB (ref 13.0–17.0)
Hemoglobin: 12 g/dL — ABNORMAL LOW (ref 13.0–17.0)
MCH: 29.2 pg (ref 26.0–34.0)
MCH: 29.6 pg (ref 26.0–34.0)
MCHC: 33.1 g/dL (ref 30.0–36.0)
MCHC: 33.4 g/dL (ref 30.0–36.0)
MCV: 87.4 fL (ref 78.0–100.0)
MCV: 89.4 fL (ref 78.0–100.0)
PLATELETS: 256 10*3/uL (ref 150–400)
Platelets: 255 10*3/uL (ref 150–400)
RBC: 3.8 MIL/uL — ABNORMAL LOW (ref 4.22–5.81)
RBC: 4.05 MIL/uL — ABNORMAL LOW (ref 4.22–5.81)
RDW: 14.3 % (ref 11.5–15.5)
RDW: 14.6 % (ref 11.5–15.5)
WBC: 12.8 10*3/uL — ABNORMAL HIGH (ref 4.0–10.5)
WBC: 13.4 10*3/uL — AB (ref 4.0–10.5)

## 2016-02-23 LAB — COMPREHENSIVE METABOLIC PANEL
ALBUMIN: 3.3 g/dL — AB (ref 3.5–5.0)
ALT: 284 U/L — ABNORMAL HIGH (ref 17–63)
AST: 66 U/L — AB (ref 15–41)
Alkaline Phosphatase: 118 U/L (ref 38–126)
Anion gap: 9 (ref 5–15)
BUN: 11 mg/dL (ref 6–20)
CHLORIDE: 108 mmol/L (ref 101–111)
CO2: 25 mmol/L (ref 22–32)
Calcium: 9 mg/dL (ref 8.9–10.3)
Creatinine, Ser: 0.82 mg/dL (ref 0.61–1.24)
GFR calc Af Amer: >60 mL/min (ref >60)
GFR calc non Af Amer: >60 mL/min (ref >60)
GLUCOSE: 138 mg/dL — AB (ref 65–99)
POTASSIUM: 4 mmol/L (ref 3.5–5.1)
SODIUM: 142 mmol/L (ref 135–145)
Total Bilirubin: 1.6 mg/dL — ABNORMAL HIGH (ref 0.3–1.2)
Total Protein: 6.6 g/dL (ref 6.5–8.1)

## 2016-04-02 ENCOUNTER — Ambulatory Visit: Payer: 59 | Admitting: Internal Medicine

## 2016-06-11 ENCOUNTER — Inpatient Hospital Stay: Admit: 2016-06-11 | Payer: Self-pay | Admitting: General Surgery

## 2016-06-11 SURGERY — LAPAROSCOPIC CHOLECYSTECTOMY WITH INTRAOPERATIVE CHOLANGIOGRAM
Anesthesia: General

## 2016-10-03 IMAGING — CT CT ABD-PELV W/ CM
2 of 4 series · 16 of 46 positions shown, 18 images · IV contrast (omnipaque)
Comparison: None.

CLINICAL DATA: Umbilical pain 5 days with nausea, vomiting and
diarrhea.

EXAM:
CT ABDOMEN AND PELVIS WITH CONTRAST
TECHNIQUE: Multidetector CT imaging of the abdomen and pelvis was performed
using the standard protocol following bolus administration of
intravenous contrast.
CONTRAST:  25mL OMNIPAQUE IOHEXOL 300 MG/ML SOLN, 100mL [id number]
IOPAMIDOL ([id number]) INJECTION 61%

[Series 2: abd/pel with · axial · 0.84mm/px · z∈[-474,+6]mm · 13 of 108 slices shown, 15 images]
[im 6/108  soft-tissue]
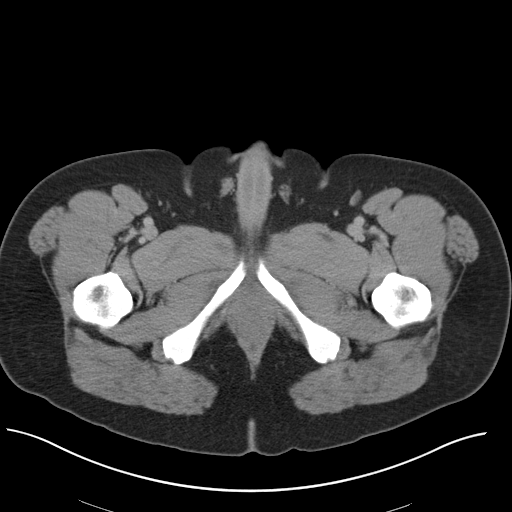
[im 6/108  bone]
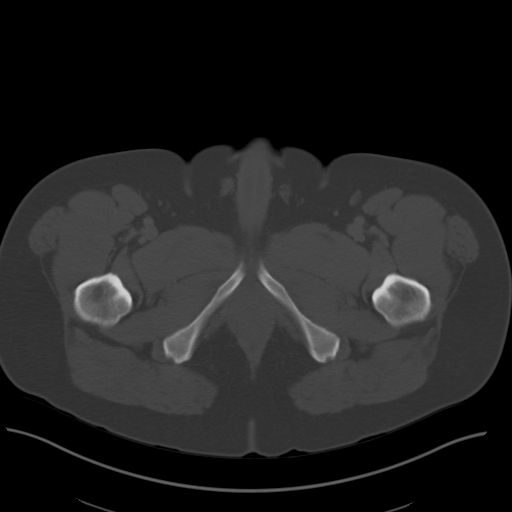
[im 16/108  soft-tissue]
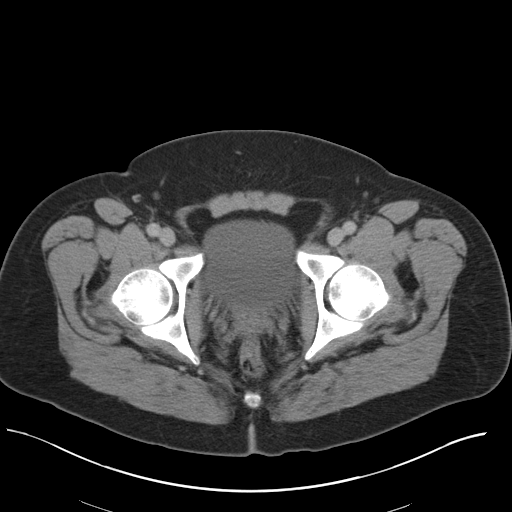
[im 21/108  soft-tissue]
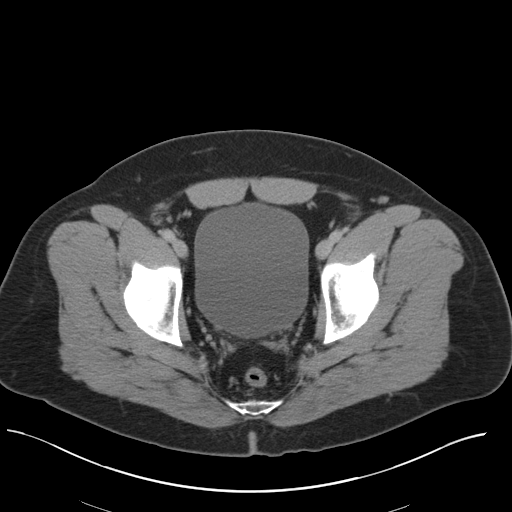
[im 31/108  soft-tissue]
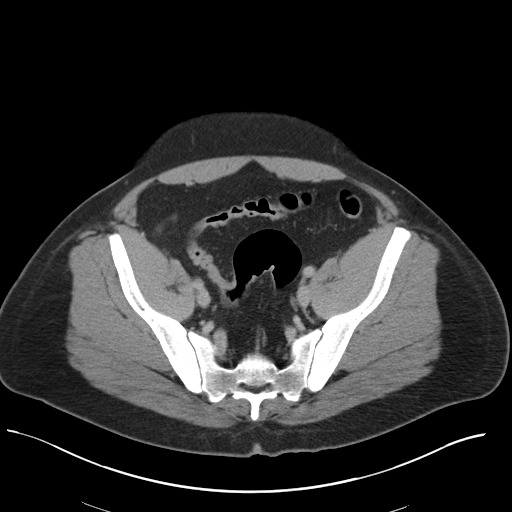
[im 36/108  soft-tissue]
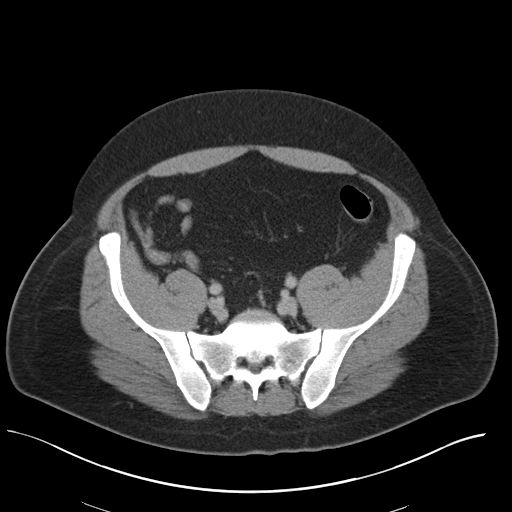
[im 46/108  soft-tissue]
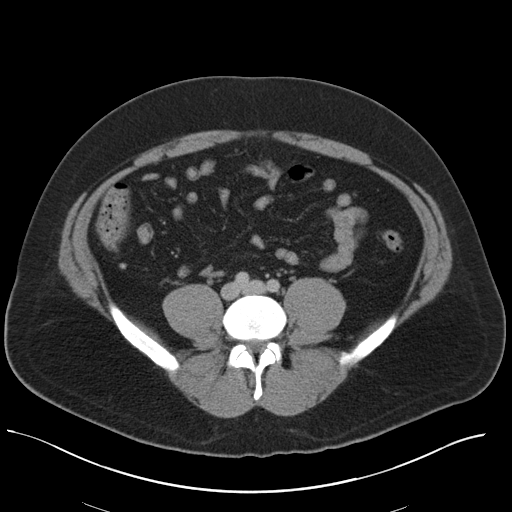
[im 57/108  soft-tissue]
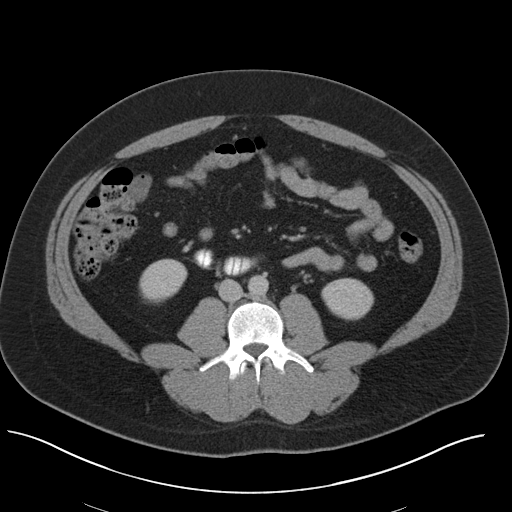
[im 62/108  soft-tissue]
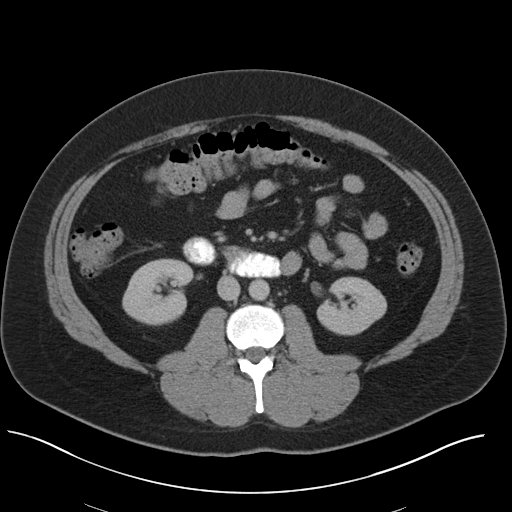
[im 72/108  soft-tissue]
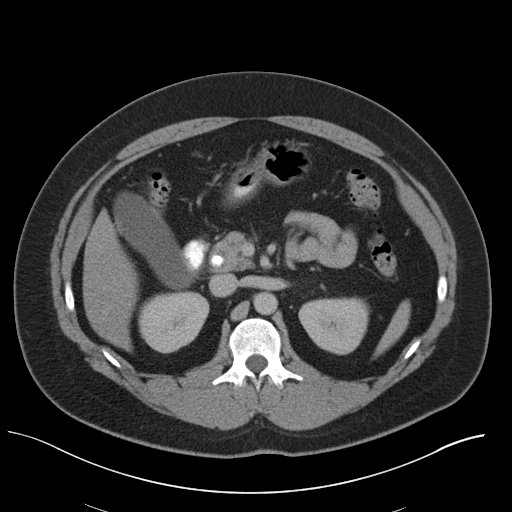
[im 72/108  bone]
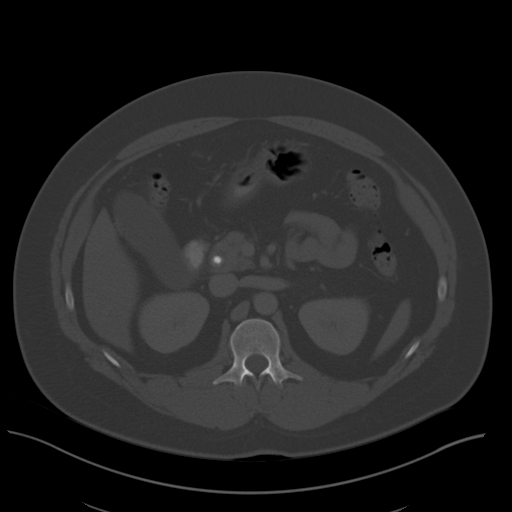
[im 77/108  soft-tissue]
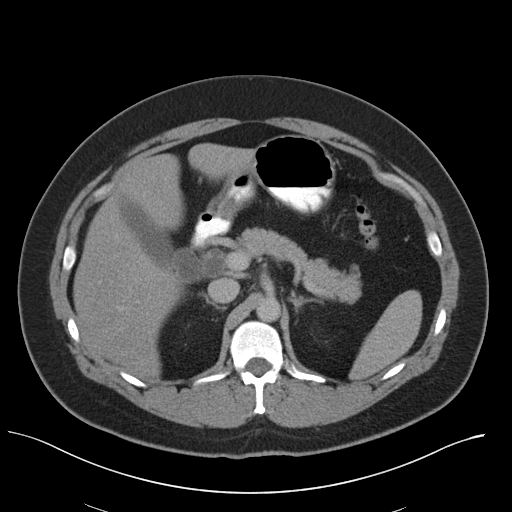
[im 87/108  soft-tissue]
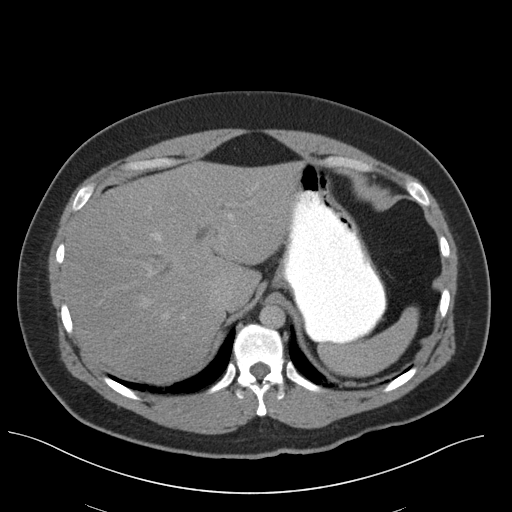
[im 92/108  soft-tissue]
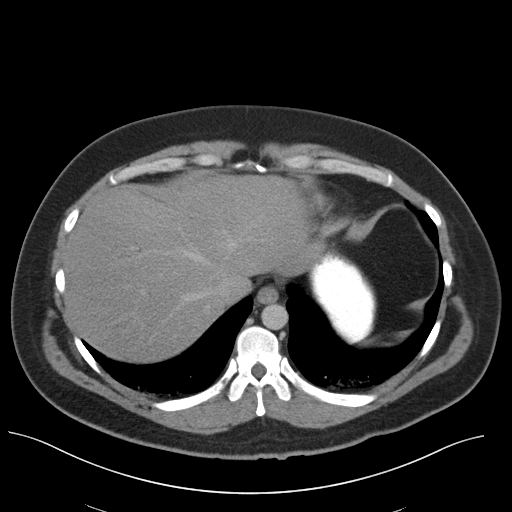
[im 102/108  soft-tissue]
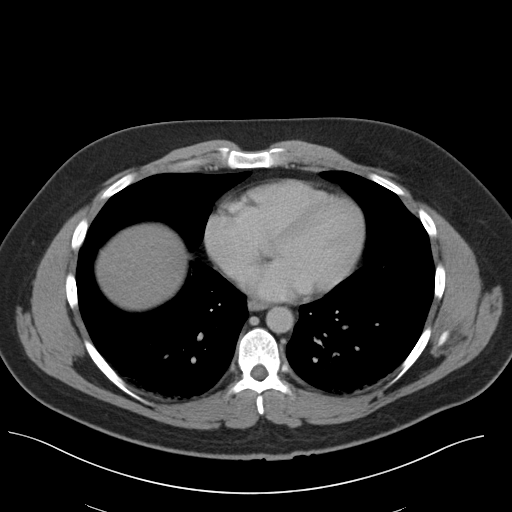

[Series 5: coronal a/|p · coronal · 0.86mm/px · 3 of 178 slices shown]
[im 60/178  soft-tissue]
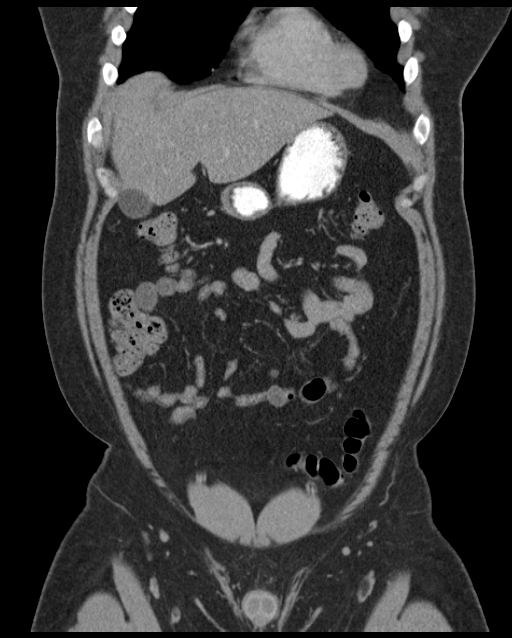
[im 79/178  soft-tissue]
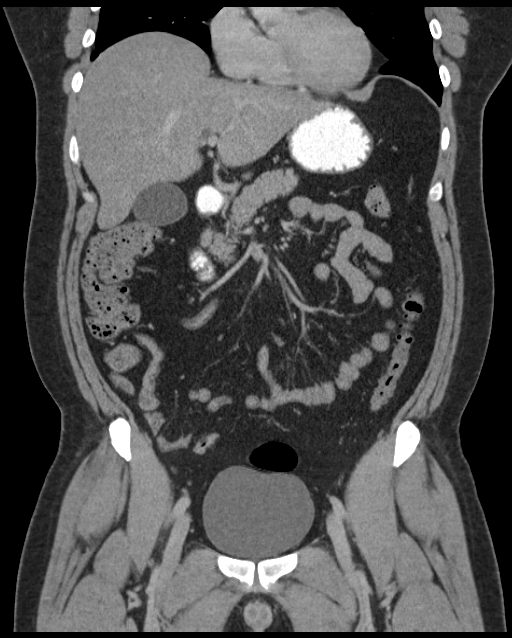
[im 99/178  soft-tissue]
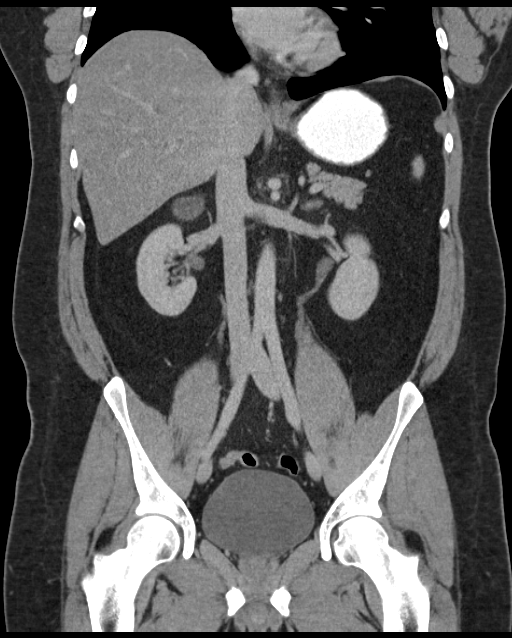

[16 of 46 positions shown; findings below may reference images not displayed]

FINDINGS: Lung bases demonstrate minimal dependent bibasilar atelectasis. 3 mm
nodule over the right middle lobe.

Abdominal images demonstrate mild distention of the gallbladder.
There is dilatation of the common bowel duct during 1.5 cm with a
7-8 mm stone over the distal common bowel duct at the head of the
pancreas. Subtle prominence of the central intrahepatic ducts.

The spleen, pancreas and adrenal glands are normal. Stomach is
normal.

Kidneys are normal in size without hydronephrosis or
nephrolithiasis. Ureters are normal.

Vascular structures are within normal.

Appendix is normal.  Colon small bowel are within normal.

Pelvic images demonstrate the bladder, prostate and rectum to be
within normal. The remaining bones and soft tissues are within
normal.
IMPRESSION: 7-8 mm stone over the distal common bile duct causing dilatation of
the common bile duct measuring 1.5 cm and mild gallbladder
distention.

## 2024-06-24 ENCOUNTER — Telehealth: Payer: Self-pay | Admitting: Internal Medicine

## 2024-06-24 NOTE — Telephone Encounter (Signed)
 Dr Wilhelmenia see referral   Referral Referral # [id number] Referral Information  Referral # Creation Date Referral Status Status Update   [id number] 06/19/2024 Authorized 06/19/2024: Status History     Status Reason Referral Type Referral Reasons Referral Class  Covered Benefit Consultation Specialty Services Required Incoming     To Department Specialty To Provider To Location/Place of Service To Department  Gastroenterology none none [facility]    To Vendor Referred By By Location/Place of Service By Department  none Minor, Calton SAUNDERS, MD none none     Priority Start Date Expiration Date Referral Entered By  Urgent 06/19/2024 12/20/2024 Mannie Krabbe S     Visits Requested Visits Authorized Visits Completed Visits Scheduled  1 1      Diagnosis Information  Free Text Diagnosis  cholelithlasis with obstruction. may need EUS

## 2024-06-24 NOTE — Telephone Encounter (Signed)
 Patient called and stated that he has a referral for an EUS. Patient is requesting a call back in order to schedule an appointment. Please advise.

## 2024-06-24 NOTE — Telephone Encounter (Signed)
 Corean have you seen a referral for this pt? I have not gotten anything

## 2024-06-25 ENCOUNTER — Other Ambulatory Visit: Payer: Self-pay

## 2024-06-25 DIAGNOSIS — Z4689 Encounter for fitting and adjustment of other specified devices: Secondary | ICD-10-CM

## 2024-06-25 NOTE — Telephone Encounter (Addendum)
 I have this patient's chart. I reviewed his records that suggest he previously had ERCP and biliary stenting.  He never had a stent removed. Although I cannot see the MRI/MRCP it looks like he has choledocholithiasis and biliary sludge he may have a stent still in place.  Hopefully does not migrate upwards. AST/ALT 99/137 Alkaline phosphatase 1119 Total bili 2.4 Direct bili 1.5 Has been having abdominal pain for 2 months after I called and spoke with him on 8/6 PM. Patient does not need EUS. Patient needs ERCP.  Our 2nd to last patient on 8/11 is going to be rescheduled (see separate message for him). We now have 2 hours of time available. Please place this patient on the list for ERCP on 8/11 and make movements in regards to patients so we can use all of our time for the day. Thank you. Patient is expecting call back on Thursday (I again spoke with him and he is in agreement to be available for a Monday procedure). Thanks. GM

## 2024-06-25 NOTE — Telephone Encounter (Signed)
 ERCP has been entered for 06/29/24 at 145 pm at [facility] with GM

## 2024-06-25 NOTE — Telephone Encounter (Signed)
ERCP scheduled, pt instructed and medications reviewed.  Patient instructions mailed to home.  Patient to call with any questions or concerns.  

## 2024-06-26 ENCOUNTER — Encounter (HOSPITAL_COMMUNITY): Payer: Self-pay | Admitting: Gastroenterology

## 2024-06-29 ENCOUNTER — Ambulatory Visit (HOSPITAL_BASED_OUTPATIENT_CLINIC_OR_DEPARTMENT_OTHER): Admitting: Certified Registered Nurse Anesthetist

## 2024-06-29 ENCOUNTER — Ambulatory Visit (HOSPITAL_COMMUNITY)
Admission: RE | Admit: 2024-06-29 | Discharge: 2024-06-29 | Disposition: A | Discharge status: Home / Self Care | Attending: Gastroenterology | Admitting: Gastroenterology

## 2024-06-29 ENCOUNTER — Ambulatory Visit (HOSPITAL_COMMUNITY)

## 2024-06-29 ENCOUNTER — Ambulatory Visit (HOSPITAL_COMMUNITY): Admitting: Certified Registered Nurse Anesthetist

## 2024-06-29 ENCOUNTER — Encounter (HOSPITAL_COMMUNITY)
Admission: RE | Disposition: A | Discharge status: Home / Self Care | Payer: Self-pay | Source: Home / Self Care | Attending: Gastroenterology

## 2024-06-29 ENCOUNTER — Telehealth: Payer: Self-pay

## 2024-06-29 ENCOUNTER — Encounter (HOSPITAL_COMMUNITY): Payer: Self-pay | Admitting: Gastroenterology

## 2024-06-29 ENCOUNTER — Other Ambulatory Visit: Payer: Self-pay

## 2024-06-29 DIAGNOSIS — R1084 Generalized abdominal pain: Secondary | ICD-10-CM | POA: Insufficient documentation

## 2024-06-29 DIAGNOSIS — T85590A Other mechanical complication of bile duct prosthesis, initial encounter: Secondary | ICD-10-CM | POA: Insufficient documentation

## 2024-06-29 DIAGNOSIS — K2950 Unspecified chronic gastritis without bleeding: Secondary | ICD-10-CM | POA: Diagnosis not present

## 2024-06-29 DIAGNOSIS — R748 Abnormal levels of other serum enzymes: Secondary | ICD-10-CM

## 2024-06-29 DIAGNOSIS — F17200 Nicotine dependence, unspecified, uncomplicated: Secondary | ICD-10-CM | POA: Insufficient documentation

## 2024-06-29 DIAGNOSIS — K760 Fatty (change of) liver, not elsewhere classified: Secondary | ICD-10-CM | POA: Insufficient documentation

## 2024-06-29 DIAGNOSIS — L4050 Arthropathic psoriasis, unspecified: Secondary | ICD-10-CM | POA: Insufficient documentation

## 2024-06-29 DIAGNOSIS — K8070 Calculus of gallbladder and bile duct without cholecystitis without obstruction: Secondary | ICD-10-CM | POA: Diagnosis not present

## 2024-06-29 DIAGNOSIS — T85898A Other specified complication of other internal prosthetic devices, implants and grafts, initial encounter: Secondary | ICD-10-CM | POA: Diagnosis not present

## 2024-06-29 DIAGNOSIS — Y738 Miscellaneous gastroenterology and urology devices associated with adverse incidents, not elsewhere classified: Secondary | ICD-10-CM | POA: Diagnosis not present

## 2024-06-29 DIAGNOSIS — K831 Obstruction of bile duct: Secondary | ICD-10-CM

## 2024-06-29 DIAGNOSIS — Z4659 Encounter for fitting and adjustment of other gastrointestinal appliance and device: Secondary | ICD-10-CM | POA: Diagnosis not present

## 2024-06-29 DIAGNOSIS — K8050 Calculus of bile duct without cholangitis or cholecystitis without obstruction: Secondary | ICD-10-CM

## 2024-06-29 DIAGNOSIS — K2970 Gastritis, unspecified, without bleeding: Secondary | ICD-10-CM

## 2024-06-29 DIAGNOSIS — K838 Other specified diseases of biliary tract: Secondary | ICD-10-CM

## 2024-06-29 DIAGNOSIS — Z9049 Acquired absence of other specified parts of digestive tract: Secondary | ICD-10-CM

## 2024-06-29 DIAGNOSIS — Z4689 Encounter for fitting and adjustment of other specified devices: Secondary | ICD-10-CM

## 2024-06-29 DIAGNOSIS — R7989 Other specified abnormal findings of blood chemistry: Secondary | ICD-10-CM

## 2024-06-29 HISTORY — PX: ERCP: SHX5425

## 2024-06-29 LAB — HEPATIC FUNCTION PANEL
ALT: 113 U/L — ABNORMAL HIGH (ref 0–44)
AST: 95 U/L — ABNORMAL HIGH (ref 15–41)
Albumin: 3 g/dL — ABNORMAL LOW (ref 3.5–5.0)
Alkaline Phosphatase: 727 U/L — ABNORMAL HIGH (ref 38–126)
Bilirubin, Direct: 1.5 mg/dL — ABNORMAL HIGH (ref 0.0–0.2)
Indirect Bilirubin: 1.3 mg/dL — ABNORMAL HIGH (ref 0.3–0.9)
Total Bilirubin: 2.8 mg/dL — ABNORMAL HIGH (ref 0.0–1.2)
Total Protein: 7.3 g/dL (ref 6.5–8.1)

## 2024-06-29 SURGERY — ERCP, WITH INTERVENTION IF INDICATED
Anesthesia: General

## 2024-06-29 MED ORDER — SUGAMMADEX SODIUM 200 MG/2ML IV SOLN
INTRAVENOUS | Status: DC | PRN
  Administered 2024-06-29 (×4): 200 mg via INTRAVENOUS

## 2024-06-29 MED ORDER — ROCURONIUM BROMIDE 10 MG/ML (PF) SYRINGE
PREFILLED_SYRINGE | INTRAVENOUS | Status: DC | PRN
  Administered 2024-06-29 (×2): 60 mg via INTRAVENOUS

## 2024-06-29 MED ORDER — PROPOFOL 10 MG/ML IV BOLUS
INTRAVENOUS | Status: AC
  Filled 2024-06-29: qty 20

## 2024-06-29 MED ORDER — CIPROFLOXACIN HCL 500 MG PO TABS: 500.0000 mg | ORAL_TABLET | Freq: Two times a day (BID) | ORAL | 0 refills | Status: AC

## 2024-06-29 MED ORDER — LIDOCAINE HCL (PF) 2 % IJ SOLN
INTRAMUSCULAR | Status: DC | PRN
  Administered 2024-06-29 (×2): 100 mg via INTRADERMAL

## 2024-06-29 MED ORDER — GLUCAGON HCL RDNA (DIAGNOSTIC) 1 MG IJ SOLR
INTRAMUSCULAR | Status: DC | PRN
  Administered 2024-06-29 (×6): .25 mg via INTRAVENOUS

## 2024-06-29 MED ORDER — LACTATED RINGERS IV SOLN
INTRAVENOUS | Status: AC | PRN
  Administered 2024-06-29 (×2): 1000 mL via INTRAVENOUS

## 2024-06-29 MED ORDER — PROPOFOL 500 MG/50ML IV EMUL
INTRAVENOUS | Status: DC | PRN
Start: 2024-06-29 — End: 2024-06-29
  Administered 2024-06-29 (×2): 200 mg via INTRAVENOUS

## 2024-06-29 MED ORDER — DEXAMETHASONE SODIUM PHOSPHATE 10 MG/ML IJ SOLN
INTRAMUSCULAR | Status: DC | PRN
  Administered 2024-06-29 (×2): 10 mg via INTRAVENOUS

## 2024-06-29 MED ORDER — SODIUM CHLORIDE 0.9 % IV SOLN: INTRAVENOUS | Status: DC

## 2024-06-29 MED ORDER — CIPROFLOXACIN IN D5W 400 MG/200ML IV SOLN
INTRAVENOUS | Status: DC | PRN
Start: 2024-06-29 — End: 2024-06-29
  Administered 2024-06-29 (×2): 400 mg via INTRAVENOUS

## 2024-06-29 MED ORDER — DICLOFENAC SUPPOSITORY 100 MG
RECTAL | Status: AC
  Filled 2024-06-29: qty 1

## 2024-06-29 MED ORDER — ONDANSETRON HCL 4 MG/2ML IJ SOLN
INTRAMUSCULAR | Status: DC | PRN
Start: 2024-06-29 — End: 2024-06-29
  Administered 2024-06-29 (×2): 4 mg via INTRAVENOUS

## 2024-06-29 MED ORDER — CIPROFLOXACIN HCL 500 MG PO TABS: 500.0000 mg | ORAL_TABLET | Freq: Two times a day (BID) | ORAL | 0 refills | Status: DC

## 2024-06-29 MED ORDER — FENTANYL CITRATE (PF) 100 MCG/2ML IJ SOLN
INTRAMUSCULAR | Status: DC | PRN
  Administered 2024-06-29 (×4): 50 ug via INTRAVENOUS

## 2024-06-29 MED ORDER — GLUCAGON HCL RDNA (DIAGNOSTIC) 1 MG IJ SOLR
INTRAMUSCULAR | Status: AC
  Filled 2024-06-29: qty 1

## 2024-06-29 MED ORDER — SODIUM CHLORIDE 0.9 % IV SOLN
INTRAVENOUS | Status: DC | PRN
  Administered 2024-06-29 (×2): 50 mL

## 2024-06-29 MED ORDER — DICLOFENAC SUPPOSITORY 100 MG
RECTAL | Status: DC | PRN
  Administered 2024-06-29 (×2): 100 mg via RECTAL

## 2024-06-29 MED ORDER — CIPROFLOXACIN IN D5W 400 MG/200ML IV SOLN
INTRAVENOUS | Status: AC
  Filled 2024-06-29: qty 200

## 2024-06-29 MED ORDER — FENTANYL CITRATE (PF) 100 MCG/2ML IJ SOLN
INTRAMUSCULAR | Status: AC
  Filled 2024-06-29: qty 2

## 2024-06-29 NOTE — Transfer of Care (Signed)
 Immediate Anesthesia Transfer of Care Note  Patient: Craig Roth  Procedure(s) Performed: ERCP, WITH INTERVENTION IF INDICATED  Patient Location: PACU  Anesthesia Type:General  Level of Consciousness: awake, alert , oriented, and patient cooperative  Airway & Oxygen Therapy: Patient Spontanous Breathing and Patient connected to face mask oxygen  Post-op Assessment: Report given to RN and Post -op Vital signs reviewed and stable  Post vital signs: Reviewed and stable  Last Vitals:  Vitals Value Taken Time  BP 145/63 06/29/24 15:08  Temp    Pulse 97 06/29/24 15:10  Resp 11 06/29/24 15:10  SpO2 95 % 06/29/24 15:10  Vitals shown include unfiled device data.  Last Pain:  Vitals:   06/29/24 1243  TempSrc: Temporal  PainSc: 0-No pain         Complications: No notable events documented.

## 2024-06-29 NOTE — Op Note (Signed)
 [facility] Patient Name: Craig Roth Procedure Date: 06/29/2024 MRN: [id number] Attending MD: Aloha Finner , MD, [id number] Date of Birth: 1981/01/11 CSN: [id number] Age: 43 Admit Type: Outpatient Procedure:                ERCP Indications:              Bile duct stone(s), Generalized abdominal pain,                            Elevated liver enzymes, Biliary stent removal Providers:                Aloha Finner, MD, Randall Lines, RN, [facility]                            [place], Technician, Vernell Molt, CRNA Referring MD:              Medicines:                General Anesthesia, Cipro  400 mg IV, Diclofenac  100                            mg rectal, Glucagon  0.75 mg IV Complications:            No immediate complications. Estimated Blood Loss:     Estimated blood loss was minimal. Procedure:                Pre-Anesthesia Assessment:                           - Prior to the procedure, a History and Physical                            was performed, and patient medications and                            allergies were reviewed. The patient's tolerance of                            previous anesthesia was also reviewed. The risks                            and benefits of the procedure and the sedation                            options and risks were discussed with the patient.                            All questions were answered, and informed consent                            was obtained. Prior Anticoagulants: The patient has                            taken no anticoagulant or antiplatelet agents. ASA  Grade Assessment: II - A patient with mild systemic                            disease. After reviewing the risks and benefits,                            the patient was deemed in satisfactory condition to                            undergo the procedure.                           After obtaining informed consent, the scope was                             passed under direct vision. Throughout the                            procedure, the patient's blood pressure, pulse, and                            oxygen saturations were monitored continuously. The                            [employer] D single use                            duodenoscope was introduced through the mouth, and                            used to inject contrast into and used to inject                            contrast into the bile duct. The ERCP was                            accomplished without difficulty. The patient                            tolerated the procedure. Scope In: Scope Out: Findings:      A scout film of the abdomen was obtained. Surgical clips, consistent       with previous cholecystectomy, were seen in the area of the right upper       quadrant of the abdomen. One stent ending in the main bile duct was seen.      The upper GI tract was traversed under direct vision without detailed       examination. Patchy mildly erythematous mucosa without bleeding was       found in the entire examined stomach - biopsied for H. pylori       assessment. No gross lesions were noted in the duodenal bulb, in the       first portion of the duodenum and in the second portion of the duodenum.       A biliary sphincterotomy had been performed. The sphincterotomy  appeared       open. One plastic biliary stent originating in the biliary tree was       emerging from the major papilla. The stent was totally occluded. This       stent was removed from the biliary tree using a Raptor grasping device.      A 0.035 inch x 260 cm straight Hydra Jagwire was passed into the biliary       tree. The short-nosed traction sphincterotome was passed over the       guidewire and the bile duct was then deeply cannulated. Contrast was       injected. I personally interpreted the bile duct images. Ductal flow of       contrast was adequate.  Image quality was adequate. Contrast extended to       the hepatic ducts. Opacification of the entire biliary tree except for       the gallbladder was successful. The main bile duct contained filling       defects thought to be stones and sludge. The middle third of the main       bile duct and upper third of the main bile duct were moderately dilated.       The largest diameter was 18 mm. Dilation of the distal common bile duct       as a sphincteroplasty for DASE, with an 06-27-09 mm x 3 cm CRE balloon (to       a maximum balloon size of 10 mm) and a 08-30-11 mm x 3 cm CRE balloon       (to a maximum balloon size of 12 mm) dilator was successful for a total       of 3 minutes. To discover objects, the biliary tree was swept with a       retrieval balloon. Sludge was swept from the duct. Many stones and stone       fragments were removed. A few stones remained and continued significant       amount of sludge debris was pouring out of the bile duct tree. Decision       made to allow biliary decompression. Two plastic biliary stents (10 Fr       by 7 cm & 10 Fr by 5 cm) with single external flap and a single internal       flap were placed into the common bile duct. The stents were in good       position.      A pancreatogram was not performed.      The duodenoscope was withdrawn from the patient. Impression:               - Erythematous mucosa in the stomach. Biopsied.                           - No gross lesions in the duodenal bulb, in the                            first portion of the duodenum and in the second                            portion of the duodenum.                           -  Prior biliary sphincterotomy appeared open.                           - One totally occluded stent from the biliary tree                            was seen in the major papilla. This was removed.                           - Filling defects consistent with significant                            stones  and sludge was seen on the cholangiogram.                           - The upper third of the main bile duct and middle                            third of the main bile duct were moderately dilated.                           - Sphincteroplasty performed to 12 mm.                           - Choledocholithiasis was found. Sweeping did not                            remove all the stones; two biliary stents                            wereinserted. Moderate Sedation:      Not Applicable - Patient had care per Anesthesia. Recommendation:           - The patient will be observed post-procedure,                            until all discharge criteria are met.                           - Discharge patient to home.                           - Patient has a contact number available for                            emergencies. The signs and symptoms of potential                            delayed complications were discussed with the                            patient. Return to normal activities tomorrow.  Written discharge instructions were provided to the                            patient.                           - Low fat diet.                           - Observe patient's clinical course.                           - Ciprofloxacin  500 mg twice daily for 3 days to                            decrease risk of post interventional infection.                           - Minimize nonsteroidal medications for next week                            to decrease risk of post interventional bleeding.                           - Repeat LFTs in 2 weeks. This can be done at our                            office.                           - Patient will need to obtain MRI/MRCP CD from [place]                            and please bring it to our office so this can be                            uploaded.                           - Observe patient's clinical course.                            - Repeat ERCP in 2 months to remove stent.                           - Watch for pancreatitis, bleeding, perforation,                            and cholangitis.                           - The findings and recommendations were discussed                            with the patient.                           -  The findings and recommendations were discussed                            with the designated responsible adult. Procedure Code(s):        --- Professional ---                           [phone], Endoscopic retrograde                            cholangiopancreatography (ERCP); with removal and                            exchange of stent(s), biliary or pancreatic duct,                            including pre- and post-dilation and guide wire                            passage, when performed, including sphincterotomy,                            when performed, each stent exchanged                           43276, 59, Endoscopic retrograde                            cholangiopancreatography (ERCP); with removal and                            exchange of stent(s), biliary or pancreatic duct,                            including pre- and post-dilation and guide wire                            passage, when performed, including sphincterotomy,                            when performed, each stent exchanged                           43264, Endoscopic retrograde                            cholangiopancreatography (ERCP); with removal of                            calculi/debris from biliary/pancreatic duct(s)                           25671, Endoscopic catheterization of the biliary                            ductal system, radiological supervision and  interpretation Diagnosis Code(s):        --- Professional ---                           K31.89, Other diseases of stomach and duodenum                           T85.590A, Other mechanical complication of bile                             duct prosthesis, initial encounter                           K80.50, Calculus of bile duct without cholangitis                            or cholecystitis without obstruction                           Z46.59, Encounter for fitting and adjustment of                            other gastrointestinal appliance and device                           R10.84, Generalized abdominal pain                           R74.8, Abnormal levels of other serum enzymes                           K83.8, Other specified diseases of biliary tract                           R93.2, Abnormal findings on diagnostic imaging of                            liver and biliary tract CPT copyright 2022 American [employer] Association. All rights reserved. The codes documented in this report are preliminary and upon coder review may  be revised to meet current compliance requirements. Aloha Finner, MD 06/29/2024 3:17:25 PM Number of Addenda: 0

## 2024-06-29 NOTE — Anesthesia Procedure Notes (Signed)
 Procedure Name: Intubation Date/Time: 06/29/2024 1:26 PM  Performed by: Joshua Vernell BROCKS, CRNAPre-anesthesia Checklist: Patient identified, Emergency Drugs available, Suction available and Patient being monitored Patient Re-evaluated:Patient Re-evaluated prior to induction Oxygen Delivery Method: Circle system utilized Preoxygenation: Pre-oxygenation with 100% oxygen Induction Type: IV induction Ventilation: Mask ventilation without difficulty Laryngoscope Size: Mac and 4 Grade View: Grade I Tube type: Oral Tube size: 7.5 mm Number of attempts: 1 Airway Equipment and Method: Stylet and Oral airway Placement Confirmation: ETT inserted through vocal cords under direct vision, positive ETCO2 and breath sounds checked- equal and bilateral Secured at: 22 cm Tube secured with: Tape Dental Injury: Teeth and Oropharynx as per pre-operative assessment

## 2024-06-29 NOTE — Anesthesia Preprocedure Evaluation (Signed)
 Anesthesia Evaluation  Patient identified by MRN, date of birth, ID band Patient awake    Reviewed: Allergy & Precautions, NPO status , Patient's Chart, lab work & pertinent test results  History of Anesthesia Complications Negative for: history of anesthetic complications  Airway Mallampati: II  TM Distance: >3 FB Neck ROM: Full    Dental no notable dental hx. (+) Teeth Intact   Pulmonary neg pulmonary ROS, neg sleep apnea, neg COPD, Patient abstained from smoking.Not current smoker   Pulmonary exam normal breath sounds clear to auscultation       Cardiovascular Exercise Tolerance: Good METS(-) hypertension(-) CAD and (-) Past MI negative cardio ROS (-) dysrhythmias  Rhythm:Regular Rate:Normal - Systolic murmurs    Neuro/Psych negative neurological ROS  negative psych ROS   GI/Hepatic ,neg GERD  ,,(+)     (-) substance abuse    Endo/Other  neg diabetes    Renal/GU negative Renal ROS     Musculoskeletal   Abdominal   Peds  Hematology   Anesthesia Other Findings Past Medical History: No date: Bile duct stone No date: Choledocholithiasis No date: Cholelithiasis No date: Dilated bile duct No date: Hepatic steatosis No date: Psoriatic arthritis (HCC)  Reproductive/Obstetrics                              Anesthesia Physical Anesthesia Plan  ASA: 2  Anesthesia Plan: General   Post-op Pain Management:    Induction: Intravenous  PONV Risk Score and Plan: 2 and Ondansetron , Dexamethasone  and Midazolam   Airway Management Planned: Oral ETT  Additional Equipment: None  Intra-op Plan:   Post-operative Plan: Extubation in OR  Informed Consent: I have reviewed the patients History and Physical, chart, labs and discussed the procedure including the risks, benefits and alternatives for the proposed anesthesia with the patient or authorized representative who has indicated his/her  understanding and acceptance.     Dental advisory given  Plan Discussed with: CRNA and Surgeon  Anesthesia Plan Comments: (Discussed risks of anesthesia with patient, including PONV, sore throat, lip/dental/eye damage. Rare risks discussed as well, such as cardiorespiratory and neurological sequelae, and allergic reactions. Discussed the role of CRNA in patient's perioperative care. Patient understands.)        Anesthesia Quick Evaluation

## 2024-06-29 NOTE — Discharge Instructions (Signed)

## 2024-06-29 NOTE — H&P (Signed)
 GASTROENTEROLOGY PROCEDURE H&P NOTE   Primary Care Physician: [facility], [facility]  HPI: Craig Roth is a 43 y.o. male who presents for ERCP for biliary stent removal (placed in 2017), choledocholithiasis, biliary obstruction, abnormal LFTs.  Past Medical History:  Diagnosis Date   Bile duct stone    Choledocholithiasis    Cholelithiasis    Dilated bile duct    Hepatic steatosis    Psoriatic arthritis (HCC)    Past Surgical History:  Procedure Laterality Date   ANTERIOR CRUCIATE LIGAMENT REPAIR Right    CHOLECYSTECTOMY N/A 02/21/2016   Procedure: LAPAROSCOPIC CHOLECYSTECTOMY WITH INTRAOPERATIVE CHOLANGIOGRAM;  Surgeon: Krystal Russell, MD;  Location: [facility];  Service: General;  Laterality: N/A;   DENTAL SURGERY     ERCP N/A 02/20/2016   Procedure: ENDOSCOPIC RETROGRADE CHOLANGIOPANCREATOGRAPHY (ERCP);  Surgeon: Norleen Hint, MD;  Location: [facility];  Service: Endoscopy;  Laterality: N/A;   ERCP N/A 02/22/2016   Procedure: ENDOSCOPIC RETROGRADE CHOLANGIOPANCREATOGRAPHY (ERCP);  Surgeon: Norleen Hint, MD;  Location: [facility];  Service: Endoscopy;  Laterality: N/A;   Current Facility-Administered Medications  Medication Dose Route Frequency Provider Last Rate Last Admin   0.9 %  sodium chloride  infusion   Intravenous Continuous Mansouraty, Aloha Raddle., MD       lactated ringers  infusion    Continuous PRN Mansouraty, Aloha Raddle., MD 10 mL/hr at 06/29/24 1248 1,000 mL at 06/29/24 1248    Current Facility-Administered Medications:    0.9 %  sodium chloride  infusion, , Intravenous, Continuous, Mansouraty, Aloha Raddle., MD   lactated ringers  infusion, , , Continuous PRN, Mansouraty, Aloha Raddle., MD, Last Rate: 10 mL/hr at 06/29/24 1248, 1,000 mL at 06/29/24 1248 No Known Allergies Family History  Problem Relation Age of Onset   Cancer Maternal Aunt    Social History   Socioeconomic History   Marital status: Single    Spouse name: Not on file   Number of children: Not on  file   Years of education: Not on file   Highest education level: Not on file  Occupational History   Not on file  Tobacco Use   Smoking status: Some Days   Smokeless tobacco: Never  Substance and Sexual Activity   Alcohol use: Yes    Comment: rarely   Drug use: No   Sexual activity: Not on file  Other Topics Concern   Not on file  Social History Narrative   Not on file   Social Drivers of Health   Financial Resource Strain: Not on file  Food Insecurity: Not on file  Transportation Needs: Not on file  Physical Activity: Not on file  Stress: Not on file  Social Connections: Not on file  Intimate Partner Violence: Not on file    Physical Exam: Today's Vitals   06/26/24 1029 06/29/24 1243  BP:  135/89  Pulse:  76  Resp:  13  Temp:  (!) 97.3 F (36.3 C)  TempSrc:  Temporal  SpO2:  97%  Weight: 102.1 kg 102.1 kg  Height: 6' (1.829 m) 6' (1.829 m)  PainSc:  0-No pain   Body mass index is 30.52 kg/m. GEN: NAD EYE: Sclerae anicteric ENT: MMM CV: Non-tachycardic GI: Soft, NT/ND NEURO:  Alert & Oriented x 3  Lab Results: No results for input(s): WBC, HGB, HCT, PLT in the last 72 hours. BMET No results for input(s): NA, K, CL, CO2, GLUCOSE, BUN, CREATININE, CALCIUM in the last 72 hours. LFT No results for input(s): PROT, ALBUMIN, AST, ALT, ALKPHOS, BILITOT, BILIDIR,  IBILI in the last 72 hours. PT/INR No results for input(s): LABPROT, INR in the last 72 hours.   Impression / Plan: This is a 43 y.o.male who presents for ERCP for biliary stent removal (placed in 2017), choledocholithiasis, biliary obstruction, abnormal LFTs.  The risks of an ERCP were discussed at length, including but not limited to the risk of perforation, bleeding, abdominal pain, post-ERCP pancreatitis (while usually mild can be severe and even life threatening).   The risks and benefits of endoscopic evaluation/treatment were discussed with the  patient and/or family; these include but are not limited to the risk of perforation, infection, bleeding, missed lesions, lack of diagnosis, severe illness requiring hospitalization, as well as anesthesia and sedation related illnesses.  The patient's history has been reviewed, patient examined, no change in status, and deemed stable for procedure.  The patient and/or family is agreeable to proceed.    Aloha Finner, MD [facility] Advanced Endoscopy Office # [phone]

## 2024-06-29 NOTE — Telephone Encounter (Signed)
-----   Message from [facility] sent at 06/29/2024  3:19 PM EDT ----- Regarding: Followup Paras Kreider, This patient needs hepatic function panel in 2 weeks. ERCP for biliary stent removal in 2 to 3 months. Please send my ERCP note to the referring provider at the [facility]. Please try to obtain the MRI/MRCP that was performed at the [facility] in July for me to review. Thanks. GM

## 2024-06-30 ENCOUNTER — Ambulatory Visit: Payer: Self-pay | Admitting: Gastroenterology

## 2024-06-30 ENCOUNTER — Other Ambulatory Visit: Payer: Self-pay

## 2024-06-30 ENCOUNTER — Encounter (HOSPITAL_COMMUNITY): Payer: Self-pay | Admitting: Gastroenterology

## 2024-06-30 DIAGNOSIS — R748 Abnormal levels of other serum enzymes: Secondary | ICD-10-CM

## 2024-06-30 DIAGNOSIS — Z4689 Encounter for fitting and adjustment of other specified devices: Secondary | ICD-10-CM

## 2024-06-30 NOTE — Telephone Encounter (Signed)
 ERCP has been set up for 09/03/24 at 8 am at [facility] with GM   Lab order has been entered

## 2024-06-30 NOTE — Anesthesia Postprocedure Evaluation (Signed)
 Anesthesia Post Note  Patient: Craig Roth  Procedure(s) Performed: ERCP, WITH INTERVENTION IF INDICATED     Patient location during evaluation: Endoscopy Anesthesia Type: General Level of consciousness: awake and alert Pain management: pain level controlled Vital Signs Assessment: post-procedure vital signs reviewed and stable Respiratory status: spontaneous breathing, nonlabored ventilation, respiratory function stable and patient connected to nasal cannula oxygen Cardiovascular status: blood pressure returned to baseline and stable Postop Assessment: no apparent nausea or vomiting Anesthetic complications: no   No notable events documented.  Last Vitals:  Vitals:   06/29/24 1530 06/29/24 1540  BP: 110/60 129/78  Pulse: 85 78  Resp: 10 18  Temp:    SpO2: 98% 95%    Last Pain:  Vitals:   06/29/24 1530  TempSrc:   PainSc: 0-No pain                 Rome Ade

## 2024-07-01 LAB — SURGICAL PATHOLOGY

## 2024-07-01 NOTE — Telephone Encounter (Signed)
 ERCP scheduled, pt instructed and medications reviewed.  Patient instructions mailed to home.  Patient to call with any questions or concerns.   The pt aware to have labs as ordered   He will get a report of the MRI and have that prior to the ERCP.    Report has been sent as requested to the [facility]

## 2024-07-13 ENCOUNTER — Other Ambulatory Visit (INDEPENDENT_AMBULATORY_CARE_PROVIDER_SITE_OTHER)

## 2024-07-13 DIAGNOSIS — R748 Abnormal levels of other serum enzymes: Secondary | ICD-10-CM | POA: Diagnosis not present

## 2024-07-13 LAB — HEPATIC FUNCTION PANEL
ALT: 112 U/L — ABNORMAL HIGH (ref 0–53)
AST: 83 U/L — ABNORMAL HIGH (ref 0–37)
Albumin: 4.1 g/dL (ref 3.5–5.2)
Alkaline Phosphatase: 777 U/L — ABNORMAL HIGH (ref 39–117)
Bilirubin, Direct: 0.9 mg/dL — ABNORMAL HIGH (ref 0.0–0.3)
Total Bilirubin: 1.9 mg/dL — ABNORMAL HIGH (ref 0.2–1.2)
Total Protein: 7.9 g/dL (ref 6.0–8.3)

## 2024-07-14 ENCOUNTER — Ambulatory Visit: Payer: Self-pay | Admitting: Gastroenterology

## 2024-07-14 ENCOUNTER — Other Ambulatory Visit: Payer: Self-pay

## 2024-07-14 DIAGNOSIS — R748 Abnormal levels of other serum enzymes: Secondary | ICD-10-CM

## 2024-07-14 NOTE — Progress Notes (Signed)
 9/10 at 1020 am appt made with Harlene Mail PA  Lab order has been entered  The pt has been advised  He states he is doing very well at this time

## 2024-07-27 ENCOUNTER — Other Ambulatory Visit (INDEPENDENT_AMBULATORY_CARE_PROVIDER_SITE_OTHER)

## 2024-07-27 DIAGNOSIS — R748 Abnormal levels of other serum enzymes: Secondary | ICD-10-CM | POA: Diagnosis not present

## 2024-07-27 LAB — HEPATIC FUNCTION PANEL
ALT: 88 U/L — ABNORMAL HIGH (ref 0–53)
AST: 62 U/L — ABNORMAL HIGH (ref 0–37)
Albumin: 4 g/dL (ref 3.5–5.2)
Alkaline Phosphatase: 643 U/L — ABNORMAL HIGH (ref 39–117)
Bilirubin, Direct: 0.7 mg/dL — ABNORMAL HIGH (ref 0.0–0.3)
Total Bilirubin: 1.6 mg/dL — ABNORMAL HIGH (ref 0.2–1.2)
Total Protein: 7.7 g/dL (ref 6.0–8.3)

## 2024-07-28 ENCOUNTER — Ambulatory Visit: Payer: Self-pay | Admitting: Gastroenterology

## 2024-07-29 ENCOUNTER — Encounter: Payer: Self-pay | Admitting: Gastroenterology

## 2024-07-29 ENCOUNTER — Ambulatory Visit (INDEPENDENT_AMBULATORY_CARE_PROVIDER_SITE_OTHER): Admitting: Gastroenterology

## 2024-07-29 VITALS — BP 110/78 | HR 78 | Ht 72.0 in | Wt 226.8 lb

## 2024-07-29 DIAGNOSIS — K8050 Calculus of bile duct without cholangitis or cholecystitis without obstruction: Secondary | ICD-10-CM | POA: Diagnosis not present

## 2024-07-29 DIAGNOSIS — R7989 Other specified abnormal findings of blood chemistry: Secondary | ICD-10-CM

## 2024-07-29 DIAGNOSIS — R748 Abnormal levels of other serum enzymes: Secondary | ICD-10-CM

## 2024-07-29 NOTE — Patient Instructions (Signed)
 Your provider has requested that you go to the basement level for lab work on 08/10/24. Press B on the elevator. The lab is located at the first door on the left as you exit the elevator.  _______________________________________________________  If your blood pressure at your visit was 140/90 or greater, please contact your primary care physician to follow up on this.  _______________________________________________________  If you are age 44 or older, your body mass index should be between 23-30. Your Body mass index is 30.76 kg/m. If this is out of the aforementioned range listed, please consider follow up with your Primary Care Provider.  If you are age 62 or younger, your body mass index should be between 19-25. Your Body mass index is 30.76 kg/m. If this is out of the aformentioned range listed, please consider follow up with your Primary Care Provider.   ________________________________________________________  The  GI providers would like to encourage you to use MYCHART to communicate with providers for non-urgent requests or questions.  Due to long hold times on the telephone, sending your provider a message by [facility] may be a faster and more efficient way to get a response.  Please allow 48 business hours for a response.  Please remember that this is for non-urgent requests.  _______________________________________________________  [facility] is using a team-based approach to care.  Your team is made up of your doctor and two to three APPS. Our APPS (Nurse Practitioners and Physician Assistants) work with your physician to ensure care continuity for you. They are fully qualified to address your health concerns and develop a treatment plan. They communicate directly with your gastroenterologist to care for you. Seeing the Advanced Practice Practitioners on your physician's team can help you by facilitating care more promptly, often allowing for earlier appointments,  access to diagnostic testing, procedures, and other specialty referrals.

## 2024-07-29 NOTE — Progress Notes (Signed)
 07/29/2024 Craig Roth [id number] 1981/02/16   HISTORY OF PRESENT ILLNESS: This is a 43 year old male who is known to Dr. Wilhelmenia just recently for CBD stones/choledocholithiasis.  It appears that this has been a recurrent/chronic issue.  Initially had an ERCP with stent placed in 2017 by Dr. Dyane at [facility], never returned for follow-up.  Had ERCP with stent removal, sphincteroplasty and some stone extraction with 2 new stents placed last month.  Not all stones were removed.  LFTs are trending down, but not yet normal.  Is already scheduled for repeat ERCP with Dr. Wilhelmenia next month.  Has some right upper quadrant abdominal discomfort, but as long as he a fairly low-fat diet does not tend to be much of an issue.  Lab Results  Component Value Date   ALT 88 (H) 07/27/2024   AST 62 (H) 07/27/2024   ALKPHOS 643 (H) 07/27/2024   BILITOT 1.6 (H) 07/27/2024    ERCP 06/2024:  - Erythematous mucosa in the stomach. Biopsied. - No gross lesions in the duodenal bulb, in the first portion of the duodenum and in the second portion of the duodenum. - Prior biliary sphincterotomy appeared open. - One totally occluded stent from the biliary tree was seen in the major papilla. This was removed. - Filling defects consistent with significant stones and sludge was seen on the cholangiogram. - The upper third of the main bile duct and middle third of the main bile duct were moderately dilated. - Sphincteroplasty performed to 12 mm. - Choledocholithiasis was found. Sweeping did not remove all the stones; two biliary stents were inserted.   Past Medical History:  Diagnosis Date   Bile duct stone    Choledocholithiasis    Cholelithiasis    Dilated bile duct    Hepatic steatosis    Psoriatic arthritis (HCC)    Past Surgical History:  Procedure Laterality Date   ANTERIOR CRUCIATE LIGAMENT REPAIR Right    CHOLECYSTECTOMY N/A 02/21/2016   Procedure: LAPAROSCOPIC CHOLECYSTECTOMY WITH INTRAOPERATIVE  CHOLANGIOGRAM;  Surgeon: Craig Russell, MD;  Location: [facility];  Service: General;  Laterality: N/A;   DENTAL SURGERY     ERCP N/A 02/20/2016   Procedure: ENDOSCOPIC RETROGRADE CHOLANGIOPANCREATOGRAPHY (ERCP);  Surgeon: Craig Dyane, MD;  Location: [facility];  Service: Endoscopy;  Laterality: N/A;   ERCP N/A 02/22/2016   Procedure: ENDOSCOPIC RETROGRADE CHOLANGIOPANCREATOGRAPHY (ERCP);  Surgeon: Craig Dyane, MD;  Location: [facility];  Service: Endoscopy;  Laterality: N/A;   ERCP N/A 06/29/2024   Procedure: ERCP, WITH INTERVENTION IF INDICATED;  Surgeon: Craig Roth Craig Roth., MD;  Location: [facility];  Service: Gastroenterology;  Laterality: N/A;    reports that he has been smoking. He has never used smokeless tobacco. He reports current alcohol use. He reports that he does not use drugs. family history includes Brain cancer in his maternal aunt. No Known Allergies    Outpatient Encounter Medications as of 07/29/2024  Medication Sig   Multiple Vitamin (MULTIVITAMIN WITH MINERALS) TABS Take 1 tablet by mouth every morning.   Omega-3 Fatty Acids (FISH OIL) 500 MG CAPS Take 500 mg by mouth daily.   Probiotic Product (PROBIOTIC PO) Take 1 capsule by mouth daily.   sodium-potassium bicarbonate (ALKA-SELTZER GOLD) TBEF dissolvable tablet Take 2 tablets by mouth 2 (two) times daily as needed (for indigestion/stomach pain.).    HUMIRA PEN 40 MG/0.8ML PNKT Inject 40 mg as directed every 14 (fourteen) days. (Patient not taking: Reported on 07/29/2024)   No facility-administered encounter medications on  file as of 07/29/2024.    REVIEW OF SYSTEMS  : All other systems reviewed and negative except where noted in the History of Present Illness.   PHYSICAL EXAM: BP 110/78   Pulse 78   Ht 6' (1.829 m)   Wt 226 lb 12.8 oz (102.9 kg)   BMI 30.76 kg/m  General: Well developed white male in no acute distress Head: Normocephalic and atraumatic Eyes:  Sclerae anicteric, conjunctiva pink. Ears:  Normal auditory acuity Lungs: Clear throughout to auscultation; no W/R/R. Heart: Regular rate and rhythm; no M/R/G. Abdomen: Soft, non-distended.  BS present.  Non-tender. Musculoskeletal: Symmetrical with no gross deformities  Skin: No lesions on visible extremities Extremities: No edema  Neurological: Alert oriented x 4, grossly non-focal Psychological:  Alert and cooperative. 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  Initially had an ERCP with stent placed in 2017, never returned for follow-up.  Had ERCP with stent removal, sphincteroplasty and some stone extraction with 2 new stents placed last month.  Not all stones were removed.  LFTs are trending down, but not yet normal.  Is already scheduled for repeat ERCP with Dr. Wilhelmenia next month.  Will repeat LFTs again in 2 weeks.  Low fat diet.  Instructed if he were to develop worsening abdominal pain, fever/chills, yellowing of the skin or eyes that he is to contact our office and or proceed to the emergency department.   CC:  Clinic, Craig Roth

## 2024-08-01 NOTE — Progress Notes (Signed)
 Attending Physician's Attestation   I have reviewed the chart.   I agree with the Advanced Practitioner's note, impression, and recommendations with any updates as below.    Corliss Parish, MD [facility] Advanced Endoscopy Office # [phone]

## 2024-08-17 ENCOUNTER — Other Ambulatory Visit (INDEPENDENT_AMBULATORY_CARE_PROVIDER_SITE_OTHER)

## 2024-08-17 ENCOUNTER — Other Ambulatory Visit

## 2024-08-17 DIAGNOSIS — R748 Abnormal levels of other serum enzymes: Secondary | ICD-10-CM

## 2024-08-17 LAB — HEPATIC FUNCTION PANEL
ALT: 113 U/L — ABNORMAL HIGH (ref 0–53)
AST: 76 U/L — ABNORMAL HIGH (ref 0–37)
Albumin: 4.4 g/dL (ref 3.5–5.2)
Alkaline Phosphatase: 743 U/L — ABNORMAL HIGH (ref 39–117)
Bilirubin, Direct: 0.8 mg/dL — ABNORMAL HIGH (ref 0.0–0.3)
Total Bilirubin: 1.6 mg/dL — ABNORMAL HIGH (ref 0.2–1.2)
Total Protein: 8 g/dL (ref 6.0–8.3)

## 2024-08-18 ENCOUNTER — Ambulatory Visit: Payer: Self-pay | Admitting: Gastroenterology

## 2024-08-26 ENCOUNTER — Telehealth: Payer: Self-pay | Admitting: Gastroenterology

## 2024-08-26 NOTE — Telephone Encounter (Addendum)
 Procedure:ERCP Procedure date: 09/03/24 Procedure location: [facility] Arrival Time: 6:30 am Spoke with the patient Y/N: Yes Any prep concerns? No  Has the patient obtained the prep from the pharmacy ? No prep needed Do you have a care partner and transportation: Yes Any additional concerns? No

## 2024-08-27 ENCOUNTER — Encounter (HOSPITAL_COMMUNITY): Payer: Self-pay | Admitting: Gastroenterology

## 2024-08-27 NOTE — Progress Notes (Signed)
 Attempted to obtain medical history for pre op call via telephone, unable to reach at this time. HIPAA compliant voicemail message left requesting return call to pre surgical testing department.

## 2024-09-03 ENCOUNTER — Ambulatory Visit (HOSPITAL_COMMUNITY): Admitting: Anesthesiology

## 2024-09-03 ENCOUNTER — Ambulatory Visit (HOSPITAL_COMMUNITY)

## 2024-09-03 ENCOUNTER — Encounter (HOSPITAL_COMMUNITY)
Admission: RE | Disposition: A | Discharge status: Home / Self Care | Payer: Self-pay | Source: Home / Self Care | Attending: Gastroenterology

## 2024-09-03 ENCOUNTER — Encounter (HOSPITAL_COMMUNITY): Payer: Self-pay | Admitting: Gastroenterology

## 2024-09-03 ENCOUNTER — Other Ambulatory Visit: Payer: Self-pay

## 2024-09-03 ENCOUNTER — Ambulatory Visit (HOSPITAL_COMMUNITY)
Admission: RE | Admit: 2024-09-03 | Discharge: 2024-09-03 | Disposition: A | Discharge status: Home / Self Care | Attending: Gastroenterology | Admitting: Gastroenterology

## 2024-09-03 DIAGNOSIS — F17200 Nicotine dependence, unspecified, uncomplicated: Secondary | ICD-10-CM | POA: Insufficient documentation

## 2024-09-03 DIAGNOSIS — K8050 Calculus of bile duct without cholangitis or cholecystitis without obstruction: Secondary | ICD-10-CM | POA: Diagnosis not present

## 2024-09-03 DIAGNOSIS — Z6829 Body mass index (BMI) 29.0-29.9, adult: Secondary | ICD-10-CM | POA: Diagnosis not present

## 2024-09-03 DIAGNOSIS — R7989 Other specified abnormal findings of blood chemistry: Secondary | ICD-10-CM | POA: Insufficient documentation

## 2024-09-03 DIAGNOSIS — Z9049 Acquired absence of other specified parts of digestive tract: Secondary | ICD-10-CM

## 2024-09-03 DIAGNOSIS — Z4689 Encounter for fitting and adjustment of other specified devices: Secondary | ICD-10-CM

## 2024-09-03 DIAGNOSIS — K838 Other specified diseases of biliary tract: Secondary | ICD-10-CM | POA: Diagnosis not present

## 2024-09-03 DIAGNOSIS — Z4659 Encounter for fitting and adjustment of other gastrointestinal appliance and device: Secondary | ICD-10-CM | POA: Diagnosis not present

## 2024-09-03 DIAGNOSIS — R748 Abnormal levels of other serum enzymes: Secondary | ICD-10-CM

## 2024-09-03 DIAGNOSIS — E669 Obesity, unspecified: Secondary | ICD-10-CM | POA: Diagnosis not present

## 2024-09-03 HISTORY — PX: ERCP: SHX5425

## 2024-09-03 SURGERY — ERCP, WITH INTERVENTION IF INDICATED
Anesthesia: General

## 2024-09-03 MED ORDER — LACTATED RINGERS IV SOLN: INTRAVENOUS | Status: DC | PRN

## 2024-09-03 MED ORDER — LIDOCAINE 2% (20 MG/ML) 5 ML SYRINGE
INTRAMUSCULAR | Status: DC | PRN
  Administered 2024-09-03: 100 mg via INTRAVENOUS

## 2024-09-03 MED ORDER — SUGAMMADEX SODIUM 200 MG/2ML IV SOLN
INTRAVENOUS | Status: DC | PRN
  Administered 2024-09-03: 200 mg via INTRAVENOUS

## 2024-09-03 MED ORDER — FENTANYL CITRATE (PF) 100 MCG/2ML IJ SOLN
INTRAMUSCULAR | Status: AC
  Filled 2024-09-03: qty 2

## 2024-09-03 MED ORDER — SODIUM CHLORIDE 0.9 % IV SOLN: INTRAVENOUS | Status: DC

## 2024-09-03 MED ORDER — FENTANYL CITRATE (PF) 100 MCG/2ML IJ SOLN
INTRAMUSCULAR | Status: DC | PRN
  Administered 2024-09-03: 100 ug via INTRAVENOUS

## 2024-09-03 MED ORDER — CIPROFLOXACIN IN D5W 400 MG/200ML IV SOLN
INTRAVENOUS | Status: DC | PRN
  Administered 2024-09-03: 400 mg via INTRAVENOUS

## 2024-09-03 MED ORDER — GLUCAGON HCL RDNA (DIAGNOSTIC) 1 MG IJ SOLR
INTRAMUSCULAR | Status: AC
  Filled 2024-09-03: qty 1

## 2024-09-03 MED ORDER — ONDANSETRON HCL 4 MG/2ML IJ SOLN
INTRAMUSCULAR | Status: DC | PRN
  Administered 2024-09-03: 4 mg via INTRAVENOUS

## 2024-09-03 MED ORDER — PROPOFOL 10 MG/ML IV BOLUS
INTRAVENOUS | Status: DC | PRN
  Administered 2024-09-03: 180 mg via INTRAVENOUS

## 2024-09-03 MED ORDER — CIPROFLOXACIN IN D5W 400 MG/200ML IV SOLN
INTRAVENOUS | Status: AC
  Filled 2024-09-03: qty 200

## 2024-09-03 MED ORDER — SODIUM CHLORIDE 0.9 % IV SOLN
INTRAVENOUS | Status: DC | PRN
  Administered 2024-09-03: 55 mL

## 2024-09-03 MED ORDER — ROCURONIUM BROMIDE 10 MG/ML (PF) SYRINGE
PREFILLED_SYRINGE | INTRAVENOUS | Status: DC | PRN
  Administered 2024-09-03: 60 mg via INTRAVENOUS

## 2024-09-03 MED ORDER — PHENYLEPHRINE 80 MCG/ML (10ML) SYRINGE FOR IV PUSH (FOR BLOOD PRESSURE SUPPORT)
PREFILLED_SYRINGE | INTRAVENOUS | Status: DC | PRN
Start: 2024-09-03 — End: 2024-09-03
  Administered 2024-09-03 (×4): 160 ug via INTRAVENOUS

## 2024-09-03 MED ORDER — DICLOFENAC SUPPOSITORY 100 MG
RECTAL | Status: DC | PRN
Start: 2024-09-03 — End: 2024-09-03
  Administered 2024-09-03: 100 mg via RECTAL

## 2024-09-03 MED ORDER — DEXAMETHASONE SOD PHOSPHATE PF 10 MG/ML IJ SOLN
INTRAMUSCULAR | Status: DC | PRN
  Administered 2024-09-03: 10 mg via INTRAVENOUS

## 2024-09-03 NOTE — Anesthesia Postprocedure Evaluation (Signed)
 Anesthesia Post Note  Patient: Craig Roth  Procedure(s) Performed: ERCP, WITH INTERVENTION IF INDICATED     Patient location during evaluation: Endoscopy Anesthesia Type: General Level of consciousness: awake and alert Pain management: pain level controlled Vital Signs Assessment: post-procedure vital signs reviewed and stable Respiratory status: spontaneous breathing, nonlabored ventilation, respiratory function stable and patient connected to nasal cannula oxygen Cardiovascular status: blood pressure returned to baseline and stable Postop Assessment: no apparent nausea or vomiting Anesthetic complications: no   No notable events documented.  Last Vitals:  Vitals:   09/03/24 1640 09/03/24 1650  BP: 108/70 111/72  Pulse: 86 87  Resp: 11 16  Temp:    SpO2: 96% 95%    Last Pain:  Vitals:   09/03/24 1650  TempSrc:   PainSc: 0-No pain                 Garnette FORBES Skillern

## 2024-09-03 NOTE — Transfer of Care (Signed)
 Immediate Anesthesia Transfer of Care Note  Patient: Craig Roth  Procedure(s) Performed: ERCP, WITH INTERVENTION IF INDICATED  Patient Location: PACU  Anesthesia Type:General  Level of Consciousness: awake, alert , and oriented  Airway & Oxygen Therapy: Patient Spontanous Breathing and Patient connected to face mask oxygen  Post-op Assessment: Report given to RN and Post -op Vital signs reviewed and stable  Post vital signs: Reviewed and stable  Last Vitals:  Vitals Value Taken Time  BP    Temp    Pulse 91 09/03/24 16:21  Resp 14 09/03/24 16:21  SpO2 99 % 09/03/24 16:21  Vitals shown include unfiled device data.  Last Pain:  Vitals:   09/03/24 1429  TempSrc: Temporal  PainSc: 0-No pain         Complications: No notable events documented.

## 2024-09-03 NOTE — H&P (Signed)
 GASTROENTEROLOGY PROCEDURE H&P NOTE   Primary Care Physician: Clinic, [facility]  HPI: Craig Roth is a 43 y.o. male who presents for ERCP for choledocholithiasis and biliary stenting followup and elevated Alkaline phosphatase.  Past Medical History:  Diagnosis Date   Bile duct stone    Choledocholithiasis    Cholelithiasis    Dilated bile duct    Hepatic steatosis    Psoriatic arthritis (HCC)    Past Surgical History:  Procedure Laterality Date   ANTERIOR CRUCIATE LIGAMENT REPAIR Right    CHOLECYSTECTOMY N/A 02/21/2016   Procedure: LAPAROSCOPIC CHOLECYSTECTOMY WITH INTRAOPERATIVE CHOLANGIOGRAM;  Surgeon: Krystal Russell, MD;  Location: [facility];  Service: General;  Laterality: N/A;   DENTAL SURGERY     ERCP N/A 02/20/2016   Procedure: ENDOSCOPIC RETROGRADE CHOLANGIOPANCREATOGRAPHY (ERCP);  Surgeon: Norleen Hint, MD;  Location: [facility];  Service: Endoscopy;  Laterality: N/A;   ERCP N/A 02/22/2016   Procedure: ENDOSCOPIC RETROGRADE CHOLANGIOPANCREATOGRAPHY (ERCP);  Surgeon: Norleen Hint, MD;  Location: [facility];  Service: Endoscopy;  Laterality: N/A;   ERCP N/A 06/29/2024   Procedure: ERCP, WITH INTERVENTION IF INDICATED;  Surgeon: [name]., MD;  Location: [facility];  Service: Gastroenterology;  Laterality: N/A;   No current facility-administered medications for this encounter.   No current facility-administered medications for this encounter. No Known Allergies Family History  Problem Relation Age of Onset   Brain cancer Maternal Aunt    Social History   Socioeconomic History   Marital status: Single    Spouse name: Not on file   Number of children: Not on file   Years of education: Not on file   Highest education level: Not on file  Occupational History   Not on file  Tobacco Use   Smoking status: Some Days   Smokeless tobacco: Never  Substance and Sexual Activity   Alcohol use: Yes    Comment: rarely   Drug use: No   Sexual activity:  Not on file  Other Topics Concern   Not on file  Social History Narrative   Not on file   Social Drivers of Health   Financial Resource Strain: Not on file  Food Insecurity: Not on file  Transportation Needs: Not on file  Physical Activity: Not on file  Stress: Not on file  Social Connections: Not on file  Intimate Partner Violence: Not on file    Physical Exam: Today's Vitals   09/02/24 1529  Weight: 102 kg   Body mass index is 30.5 kg/m. GEN: NAD EYE: Sclerae anicteric ENT: MMM CV: Non-tachycardic GI: Soft, NT/ND NEURO:  Alert & Oriented x 3  Lab Results: No results for input(s): WBC, HGB, HCT, PLT in the last 72 hours. BMET No results for input(s): NA, K, CL, CO2, GLUCOSE, BUN, CREATININE, CALCIUM in the last 72 hours. LFT No results for input(s): PROT, ALBUMIN, AST, ALT, ALKPHOS, BILITOT, BILIDIR, IBILI in the last 72 hours. PT/INR No results for input(s): LABPROT, INR in the last 72 hours.   Impression / Plan: This is a 43 y.o.male who presents for ERCP for choledocholithiasis and biliary stenting followup and elevated Alkaline phosphatase.  The risks of an ERCP were discussed at length, including but not limited to the risk of perforation, bleeding, abdominal pain, post-ERCP pancreatitis (while usually mild can be severe and even life threatening).   The risks and benefits of endoscopic evaluation/treatment were discussed with the patient and/or family; these include but are not limited to the risk of perforation, infection, bleeding,  missed lesions, lack of diagnosis, severe illness requiring hospitalization, as well as anesthesia and sedation related illnesses.  The patient's history has been reviewed, patient examined, no change in status, and deemed stable for procedure.  The patient and/or family is agreeable to proceed.    Aloha Finner, MD [facility] Advanced Endoscopy Office #  [phone]

## 2024-09-03 NOTE — Anesthesia Preprocedure Evaluation (Addendum)
 Anesthesia Evaluation  Patient identified by MRN, date of birth, ID band Patient awake    Reviewed: Allergy & Precautions, NPO status , Patient's Chart, lab work & pertinent test results  History of Anesthesia Complications Negative for: history of anesthetic complications  Airway Mallampati: III  TM Distance: >3 FB Neck ROM: Full    Dental  (+) Teeth Intact, Dental Advisory Given   Pulmonary Current Smoker   Pulmonary exam normal breath sounds clear to auscultation       Cardiovascular negative cardio ROS Normal cardiovascular exam Rhythm:Regular Rate:Normal     Neuro/Psych negative neurological ROS     GI/Hepatic negative GI ROS,,,Elevated LFTs   Endo/Other  Obesity   Renal/GU negative Renal ROS     Musculoskeletal  (+) Arthritis ,  Psoriatic arthritis   Abdominal   Peds  Hematology negative hematology ROS (+)   Anesthesia Other Findings Psoriatic arthritis  Reproductive/Obstetrics                              Anesthesia Physical Anesthesia Plan  ASA: 2  Anesthesia Plan: General   Post-op Pain Management: Minimal or no pain anticipated   Induction: Intravenous  PONV Risk Score and Plan: 2 and Treatment may vary due to age or medical condition, Ondansetron , Dexamethasone  and Midazolam   Airway Management Planned: Oral ETT  Additional Equipment: None  Intra-op Plan:   Post-operative Plan: Extubation in OR  Informed Consent: I have reviewed the patients History and Physical, chart, labs and discussed the procedure including the risks, benefits and alternatives for the proposed anesthesia with the patient or authorized representative who has indicated his/her understanding and acceptance.     Dental advisory given  Plan Discussed with: CRNA  Anesthesia Plan Comments:          Anesthesia Quick Evaluation

## 2024-09-03 NOTE — Discharge Instructions (Signed)
YOU HAD AN ENDOSCOPIC PROCEDURE TODAY: Refer to the procedure report and other information in the discharge instructions given to you for any specific questions about what was found during the examination. If this information does not answer your questions, please call [facility] office at [phone] to clarify.   YOU SHOULD EXPECT: Some feelings of bloating in the abdomen. Passage of more gas than usual. Walking can help get rid of the air that was put into your GI tract during the procedure and reduce the bloating. If you had a lower endoscopy (such as a colonoscopy or flexible sigmoidoscopy) you may notice spotting of blood in your stool or on the toilet paper. Some abdominal soreness may be present for a day or two, also.  DIET: Your first meal following the procedure should be a light meal and then it is ok to progress to your normal diet. A half-sandwich or bowl of soup is an example of a good first meal. Heavy or fried foods are harder to digest and may make you feel nauseous or bloated. Drink plenty of fluids but you should avoid alcoholic beverages for 24 hours. If you had a esophageal dilation, please see attached instructions for diet.    ACTIVITY: Your care partner should take you home directly after the procedure. You should plan to take it easy, moving slowly for the rest of the day. You can resume normal activity the day after the procedure however YOU SHOULD NOT DRIVE, use power tools, machinery or perform tasks that involve climbing or major physical exertion for 24 hours (because of the sedation medicines used during the test).   SYMPTOMS TO REPORT IMMEDIATELY: A gastroenterologist can be reached at any hour. Please call [phone]  for any of the following symptoms:  Following lower endoscopy (colonoscopy, flexible sigmoidoscopy) Excessive amounts of blood in the stool  Significant tenderness, worsening of abdominal pains  Swelling of the abdomen that is new, acute  Fever of 100 or  higher  Following upper endoscopy (EGD, EUS, ERCP, esophageal dilation) Vomiting of blood or coffee ground material  New, significant abdominal pain  New, significant chest pain or pain under the shoulder blades  Painful or persistently difficult swallowing  New shortness of breath  Black, tarry-looking or red, bloody stools  FOLLOW UP:  If any biopsies were taken you will be contacted by phone or by letter within the next 1-3 weeks. Call [phone]  if you have not heard about the biopsies in 3 weeks.  Please also call with any specific questions about appointments or follow up tests.YOU HAD AN ENDOSCOPIC PROCEDURE TODAY: Refer to the procedure report and other information in the discharge instructions given to you for any specific questions about what was found during the examination. If this information does not answer your questions, please call [facility] office at [phone] to clarify.   YOU SHOULD EXPECT: Some feelings of bloating in the abdomen. Passage of more gas than usual. Walking can help get rid of the air that was put into your GI tract during the procedure and reduce the bloating. If you had a lower endoscopy (such as a colonoscopy or flexible sigmoidoscopy) you may notice spotting of blood in your stool or on the toilet paper. Some abdominal soreness may be present for a day or two, also.  DIET: Your first meal following the procedure should be a light meal and then it is ok to progress to your normal diet. A half-sandwich or bowl of soup is an example of a  good first meal. Heavy or fried foods are harder to digest and may make you feel nauseous or bloated. Drink plenty of fluids but you should avoid alcoholic beverages for 24 hours. If you had a esophageal dilation, please see attached instructions for diet.    ACTIVITY: Your care partner should take you home directly after the procedure. You should plan to take it easy, moving slowly for the rest of the day. You can resume  normal activity the day after the procedure however YOU SHOULD NOT DRIVE, use power tools, machinery or perform tasks that involve climbing or major physical exertion for 24 hours (because of the sedation medicines used during the test).   SYMPTOMS TO REPORT IMMEDIATELY: A gastroenterologist can be reached at any hour. Please call [phone]  for any of the following symptoms:  Following upper endoscopy (EGD, EUS, ERCP, esophageal dilation) Vomiting of blood or coffee ground material  New, significant abdominal pain  New, significant chest pain or pain under the shoulder blades  Painful or persistently difficult swallowing  New shortness of breath  Black, tarry-looking or red, bloody stools  FOLLOW UP:  If any biopsies were taken you will be contacted by phone or by letter within the next 1-3 weeks. Call [phone]  if you have not heard about the biopsies in 3 weeks.  Please also call with any specific questions about appointments or follow up tests.

## 2024-09-03 NOTE — Op Note (Signed)
 [facility] Patient Name: Craig Roth Procedure Date: 09/03/2024 MRN: [id number] Attending MD: Aloha Finner , MD, [id number] Date of Birth: 16-May-1981 CSN: [id number] Age: 43 Admit Type: Outpatient Procedure:                ERCP Indications:              Bile duct stone(s), Elevated liver enzymes, Biliary                            stent removal Providers:                Aloha Finner, MD, Hoy Penner, RN, Felice Sar, Technician Referring MD:              Medicines:                General Anesthesia, Diclofenac  100 mg rectal, Cipro                             400 mg IV Complications:            No immediate complications. Estimated Blood Loss:     Estimated blood loss was minimal. Procedure:                Pre-Anesthesia Assessment:                           - Prior to the procedure, a History and Physical                            was performed, and patient medications and                            allergies were reviewed. The patient's tolerance of                            previous anesthesia was also reviewed. The risks                            and benefits of the procedure and the sedation                            options and risks were discussed with the patient.                            All questions were answered, and informed consent                            was obtained. Prior Anticoagulants: The patient has                            taken no anticoagulant or antiplatelet agents. ASA                            Grade Assessment:  II - A patient with mild systemic                            disease. After reviewing the risks and benefits,                            the patient was deemed in satisfactory condition to                            undergo the procedure.                           After obtaining informed consent, the scope was                            passed under direct vision. Throughout the                             procedure, the patient's blood pressure, pulse, and                            oxygen saturations were monitored continuously. The                            TJF-Q190V ([id number]) Olympus duodenoscope was                            introduced through the mouth, and used to inject                            contrast into and used to inject contrast into the                            bile duct. The ERCP was accomplished without                            difficulty. The patient tolerated the procedure. Scope In: Scope Out: Findings:      A scout film of the abdomen was obtained. Stent(s) and surgical clips       consistent with a previous cholecystectomy were seen.      The esophagus was successfully intubated under direct vision without       detailed examination of the pharynx, larynx, and associated structures,       and upper GI tract. A biliary sphincterotomy had been performed. The       sphincterotomy appeared open. Two plastic biliary stents originating in       the biliary tree were emerging from the major papilla. The stents were       visibly patent. Two stents were removed from the biliary tree using a       snare. A 0.035 inch x 260 cm straight Hydra Jagwire was passed into the       biliary tree. The Hydratome sphincterotome was passed over the guidewire       and the bile duct was then deeply cannulated. Contrast was injected. I  personally interpreted the bile duct images. Ductal flow of contrast was       adequate. Image quality was adequate. Contrast extended to the hepatic       ducts. Opacification of the entire biliary tree except for the       gallbladder was successful. The main bile duct was moderately dilated.       The largest diameter was 14 mm. The lower third of the main bile duct       and middle third of the main bile duct contained filling defects thought       to be stone and sludge. The biliary tree was swept with a retrieval        balloon. Sludge was swept from the duct. Two stones were removed. No       stones remained. The cystic duct was entered and swept without finding       of any sludge. An occlusion cholangiogram was performed that showed no       further significant biliary pathology.      A pancreatogram was not performed.      The duodenoscope was withdrawn from the patient. Impression:               - Prior biliary sphincterotomy appeared open.                           - Two visibly patent stents from the biliary tree                            were seen in the major papilla. These were removed.                           - Filling defects consistent with stones and sludge                            were seen on the cholangiogram.                           - The entire main bile duct was moderately dilated.                           - Choledocholithiasis was found. Complete removal                            was accomplished by sweeping. Moderate Sedation:      Not Applicable - Patient had care per Anesthesia. Recommendation:           - The patient will be observed post-procedure,                            until all discharge criteria are met.                           - Discharge patient to home.                           - Patient has a contact number available for  emergencies. The signs and symptoms of potential                            delayed complications were discussed with the                            patient. Return to normal activities tomorrow.                            Written discharge instructions were provided to the                            patient.                           - Low fat diet.                           - Observe patient's clinical course.                           - Watch for pancreatitis, bleeding, perforation,                            and cholangitis.                           - Check liver enzymes (AST, ALT, alkaline                             phosphatase, bilirubin) in 2 weeks.                           - Return to GI office in coming weeks.                           - The findings and recommendations were discussed                            with the patient.                           - The findings and recommendations were discussed                            with the designated responsible adult. Procedure Code(s):        --- Professional ---                           [id number], Endoscopic retrograde                            cholangiopancreatography (ERCP); with removal of                            foreign body(s) or stent(s) from biliary/pancreatic  duct(s)                           43264, Endoscopic retrograde                            cholangiopancreatography (ERCP); with removal of                            calculi/debris from biliary/pancreatic duct(s)                           74328, 26, Endoscopic catheterization of the                            biliary ductal system, radiological supervision and                            interpretation Diagnosis Code(s):        --- Professional ---                           Z96.89, Presence of other specified functional                            implants                           K80.50, Calculus of bile duct without cholangitis                            or cholecystitis without obstruction                           Z46.59, Encounter for fitting and adjustment of                            other gastrointestinal appliance and device                           R74.8, Abnormal levels of other serum enzymes                           K83.8, Other specified diseases of biliary tract                           R93.2, Abnormal findings on diagnostic imaging of                            liver and biliary tract CPT copyright 2022 American Medical Association. All rights reserved. The codes documented in this report are preliminary and upon  coder review may  be revised to meet current compliance requirements. Aloha Finner, MD 09/03/2024 4:16:53 PM Number of Addenda: 0

## 2024-09-03 NOTE — Anesthesia Procedure Notes (Signed)
 Procedure Name: Intubation Date/Time: 09/03/2024 3:18 PM  Performed by: Cindie Charleen PARAS, CRNAPre-anesthesia Checklist: Patient identified, Emergency Drugs available, Suction available, Patient being monitored and Timeout performed Patient Re-evaluated:Patient Re-evaluated prior to induction Oxygen Delivery Method: Circle system utilized Preoxygenation: Pre-oxygenation with 100% oxygen Induction Type: IV induction Ventilation: Mask ventilation without difficulty Laryngoscope Size: Mac, 3 and 4 Grade View: Grade I Tube type: Oral Tube size: 7.5 mm Number of attempts: 1 Airway Equipment and Method: Stylet Placement Confirmation: ETT inserted through vocal cords under direct vision, positive ETCO2 and breath sounds checked- equal and bilateral Secured at: 23 cm Tube secured with: Tape Dental Injury: Teeth and Oropharynx as per pre-operative assessment

## 2024-09-06 ENCOUNTER — Encounter (HOSPITAL_COMMUNITY): Payer: Self-pay | Admitting: Gastroenterology

## 2024-09-17 ENCOUNTER — Other Ambulatory Visit (INDEPENDENT_AMBULATORY_CARE_PROVIDER_SITE_OTHER): Payer: Self-pay

## 2024-09-17 ENCOUNTER — Other Ambulatory Visit: Payer: Self-pay

## 2024-09-17 DIAGNOSIS — Z4689 Encounter for fitting and adjustment of other specified devices: Secondary | ICD-10-CM

## 2024-09-17 DIAGNOSIS — R748 Abnormal levels of other serum enzymes: Secondary | ICD-10-CM

## 2024-09-17 LAB — ALKALINE PHOSPHATASE: Alkaline Phosphatase: 771 U/L — ABNORMAL HIGH (ref 39–117)

## 2024-09-17 LAB — ALT: ALT: 107 U/L — ABNORMAL HIGH (ref 0–53)

## 2024-09-17 LAB — BILIRUBIN, TOTAL: Total Bilirubin: 1.7 mg/dL — ABNORMAL HIGH (ref 0.2–1.2)

## 2024-09-17 LAB — AST: AST: 73 U/L — ABNORMAL HIGH (ref 0–37)

## 2024-09-18 ENCOUNTER — Other Ambulatory Visit: Payer: Self-pay

## 2024-09-18 ENCOUNTER — Ambulatory Visit: Payer: Self-pay | Admitting: Gastroenterology

## 2024-09-18 DIAGNOSIS — R748 Abnormal levels of other serum enzymes: Secondary | ICD-10-CM

## 2024-10-12 ENCOUNTER — Other Ambulatory Visit: Payer: Self-pay

## 2024-10-12 ENCOUNTER — Other Ambulatory Visit

## 2024-10-12 ENCOUNTER — Ambulatory Visit: Payer: Self-pay | Admitting: Gastroenterology

## 2024-10-12 DIAGNOSIS — Z4689 Encounter for fitting and adjustment of other specified devices: Secondary | ICD-10-CM

## 2024-10-12 DIAGNOSIS — R748 Abnormal levels of other serum enzymes: Secondary | ICD-10-CM

## 2024-10-12 LAB — COMPREHENSIVE METABOLIC PANEL WITH GFR
ALT: 90 U/L — ABNORMAL HIGH (ref 0–53)
AST: 54 U/L — ABNORMAL HIGH (ref 0–37)
Albumin: 4.1 g/dL (ref 3.5–5.2)
Alkaline Phosphatase: 632 U/L — ABNORMAL HIGH (ref 39–117)
BUN: 10 mg/dL (ref 6–23)
CO2: 27 meq/L (ref 19–32)
Calcium: 9.4 mg/dL (ref 8.4–10.5)
Chloride: 104 meq/L (ref 96–112)
Creatinine, Ser: 0.9 mg/dL (ref 0.40–1.50)
GFR: 104.52 mL/min (ref >60.00)
Glucose, Bld: 157 mg/dL — ABNORMAL HIGH (ref 70–99)
Potassium: 4.3 meq/L (ref 3.5–5.1)
Sodium: 139 meq/L (ref 135–145)
Total Bilirubin: 1.3 mg/dL — ABNORMAL HIGH (ref 0.2–1.2)
Total Protein: 7.6 g/dL (ref 6.0–8.3)

## 2024-10-13 ENCOUNTER — Other Ambulatory Visit (INDEPENDENT_AMBULATORY_CARE_PROVIDER_SITE_OTHER): Payer: Self-pay

## 2024-10-13 DIAGNOSIS — R748 Abnormal levels of other serum enzymes: Secondary | ICD-10-CM

## 2024-10-13 DIAGNOSIS — Z4689 Encounter for fitting and adjustment of other specified devices: Secondary | ICD-10-CM

## 2024-10-13 LAB — BILIRUBIN, DIRECT: Bilirubin, Direct: 0.4 mg/dL — ABNORMAL HIGH (ref 0.0–0.3)

## 2024-10-14 ENCOUNTER — Telehealth: Payer: Self-pay | Admitting: Gastroenterology

## 2024-10-14 NOTE — Telephone Encounter (Signed)
 [facility] auth ran out 10-09-24.  Emailed records and request for service form to the [facility]. Can take up to 10 days to get the new auth#.

## 2024-10-14 NOTE — Telephone Encounter (Signed)
 Pt aware and appt to be rescheduled

## 2024-10-22 ENCOUNTER — Ambulatory Visit (HOSPITAL_COMMUNITY): Admission: RE | Admit: 2024-10-22 | Source: Ambulatory Visit

## 2024-10-22 ENCOUNTER — Telehealth: Payer: Self-pay | Admitting: Gastroenterology

## 2024-10-22 NOTE — Telephone Encounter (Signed)
 Pt has been rescheduled to 12/16

## 2024-10-22 NOTE — Telephone Encounter (Signed)
 Craig Roth,  Patient needs to reschedule his MRI.  We received the auth#  [facility] AUTH# [id number] 10/19/24 - 01/17/25

## 2024-11-03 ENCOUNTER — Other Ambulatory Visit: Payer: Self-pay | Admitting: Gastroenterology

## 2024-11-03 ENCOUNTER — Ambulatory Visit (HOSPITAL_COMMUNITY): Admission: RE | Admit: 2024-11-03 | Discharge: 2024-11-03 | Attending: Gastroenterology | Admitting: Gastroenterology

## 2024-11-03 DIAGNOSIS — Z4689 Encounter for fitting and adjustment of other specified devices: Secondary | ICD-10-CM | POA: Diagnosis present

## 2024-11-03 DIAGNOSIS — R748 Abnormal levels of other serum enzymes: Secondary | ICD-10-CM

## 2024-11-03 MED ORDER — GADOBUTROL 1 MMOL/ML IV SOLN
10.0000 mL | Freq: Once | INTRAVENOUS | Status: AC | PRN
  Administered 2024-11-03: 17:00:00 10 mL via INTRAVENOUS

## 2024-11-06 ENCOUNTER — Ambulatory Visit: Payer: Self-pay | Admitting: Gastroenterology

## 2024-11-06 ENCOUNTER — Other Ambulatory Visit: Payer: Self-pay

## 2024-11-06 DIAGNOSIS — K8050 Calculus of bile duct without cholangitis or cholecystitis without obstruction: Secondary | ICD-10-CM

## 2024-11-06 DIAGNOSIS — R748 Abnormal levels of other serum enzymes: Secondary | ICD-10-CM

## 2024-11-09 ENCOUNTER — Other Ambulatory Visit (INDEPENDENT_AMBULATORY_CARE_PROVIDER_SITE_OTHER): Payer: Self-pay

## 2024-11-09 DIAGNOSIS — K8050 Calculus of bile duct without cholangitis or cholecystitis without obstruction: Secondary | ICD-10-CM

## 2024-11-09 DIAGNOSIS — R748 Abnormal levels of other serum enzymes: Secondary | ICD-10-CM

## 2024-11-09 LAB — IBC + FERRITIN
Ferritin: 429.3 ng/mL — ABNORMAL HIGH (ref 22.0–322.0)
Iron: 75 ug/dL (ref 42–165)
Saturation Ratios: 19.7 % — ABNORMAL LOW (ref 20.0–50.0)
TIBC: 380.8 ug/dL (ref 250.0–450.0)
Transferrin: 272 mg/dL (ref 212.0–360.0)

## 2024-11-09 LAB — CK: Total CK: 53 U/L (ref 17–232)

## 2024-11-09 LAB — GAMMA GT: GGT: 864 U/L — ABNORMAL HIGH (ref 7–51)

## 2024-11-09 LAB — TSH: TSH: 0.98 u[IU]/mL (ref 0.35–5.50)

## 2024-11-10 ENCOUNTER — Ambulatory Visit: Payer: Self-pay | Admitting: Gastroenterology

## 2024-11-11 LAB — HEPATITIS C ANTIBODY: Hepatitis C Ab: NONREACTIVE

## 2024-11-11 LAB — CERULOPLASMIN: Ceruloplasmin: 44 mg/dL — ABNORMAL HIGH (ref 14–30)

## 2024-11-11 LAB — ALPHA-1-ANTITRYPSIN: A-1 Antitrypsin, Ser: 214 mg/dL — ABNORMAL HIGH (ref 83–199)

## 2024-11-11 LAB — ANTI-SMOOTH MUSCLE ANTIBODY, IGG: Actin (Smooth Muscle) Antibody (IGG): <20 U

## 2024-11-11 LAB — HEPATITIS B SURFACE ANTIGEN: Hepatitis B Surface Ag: NONREACTIVE

## 2024-11-11 LAB — HEPATITIS B SURFACE ANTIBODY,QUALITATIVE: Hep B S Ab: REACTIVE — AB

## 2024-11-11 LAB — ANA: Anti Nuclear Antibody (ANA): NEGATIVE

## 2024-11-11 LAB — HEPATITIS B CORE ANTIBODY, TOTAL: Hep B Core Total Ab: NONREACTIVE

## 2024-11-11 LAB — MITOCHONDRIAL ANTIBODIES: Mitochondrial M2 Ab, IgG: <=20 U

## 2024-11-11 LAB — HEPATITIS A ANTIBODY, TOTAL: Hepatitis A AB,Total: REACTIVE — AB

## 2024-11-13 LAB — ALKALINE PHOSPHATASE, ISOENZYMES
Alkaline Phosphatase: 715 IU/L — ABNORMAL HIGH (ref 47–123)
BONE FRACTION: 28 % (ref 12–68)
INTESTINAL FRAC.: 2 % (ref 0–18)
LIVER FRACTION: 70 % (ref 13–88)

## 2024-11-13 LAB — IMMUNOGLOBULINS A/E/G/M, SERUM
IgE (Immunoglobulin E), Serum: 41 [IU]/mL (ref 6–495)
IgG (Immunoglobin G), Serum: 1234 mg/dL (ref 603–1613)
IgM (Immunoglobulin M), Srm: 223 mg/dL — ABNORMAL HIGH (ref 20–172)
Immunoglobulin A, (IgA) QN, Serum: 244 mg/dL (ref 90–386)

## 2024-11-16 ENCOUNTER — Ambulatory Visit: Admitting: Gastroenterology

## 2024-11-16 ENCOUNTER — Encounter: Payer: Self-pay | Admitting: Gastroenterology

## 2024-11-16 VITALS — BP 132/74 | HR 68 | Ht 72.0 in | Wt 236.1 lb

## 2024-11-16 DIAGNOSIS — R748 Abnormal levels of other serum enzymes: Secondary | ICD-10-CM

## 2024-11-16 DIAGNOSIS — K8050 Calculus of bile duct without cholangitis or cholecystitis without obstruction: Secondary | ICD-10-CM | POA: Diagnosis not present

## 2024-11-16 NOTE — Patient Instructions (Signed)
 Will contact you within the next week with next steps.   _______________________________________________________  If your blood pressure at your visit was 140/90 or greater, please contact your primary care physician to follow up on this.  _______________________________________________________  If you are age 43 or older, your body mass index should be between 23-30. Your Body mass index is 32.02 kg/m. If this is out of the aforementioned range listed, please consider follow up with your Primary Care Provider.  If you are age 59 or younger, your body mass index should be between 19-25. Your Body mass index is 32.02 kg/m. If this is out of the aformentioned range listed, please consider follow up with your Primary Care Provider.   ________________________________________________________  The [facility] providers would like to encourage you to use MYCHART to communicate with providers for non-urgent requests or questions.  Due to long hold times on the telephone, sending your provider a message by [facility] may be a faster and more efficient way to get a response.  Please allow 48 business hours for a response.  Please remember that this is for non-urgent requests.  _______________________________________________________  [facility] is using a team-based approach to care.  Your team is made up of your doctor and two to three APPS. Our APPS (Nurse Practitioners and Physician Assistants) work with your physician to ensure care continuity for you. They are fully qualified to address your health concerns and develop a treatment plan. They communicate directly with your gastroenterologist to care for you. Seeing the Advanced Practice Practitioners on your physician's team can help you by facilitating care more promptly, often allowing for earlier appointments, access to diagnostic testing, procedures, and other specialty referrals.

## 2024-11-16 NOTE — Progress Notes (Signed)
 "    11/16/2024 Craig Roth [id number] 10-27-1981   HISTORY OF PRESENT ILLNESS:  This is a 43 year old male who is known to Dr. Wilhelmenia just recently for CBD stones/choledocholithiasis.  It appears that this has been a recurrent/chronic issue.  Initially had an ERCP with stent placed in 2017 by Dr. Dyane at [facility], never returned for follow-up.  Had ERCP with stent removal, sphincteroplasty and some stone extraction with 2 new stents placed in August 2025.  Not all stones were removed.  He underwent ERCP10/2025 with complete stone removal and removal of stents that were in place. ALP remains elevated in the 700s.  Labs for other causes of chronic liver disease were ordered and were negative.   MR abdomen/MRCP 10/2024:  IMPRESSION: 1. Mild hepatomegaly with subtle liver surface nodularity, concerning for underlying liver parenchymal disease/cirrhosis. Heterogeneous signal intensity of the liver with T2 hypointense areas throughout the liver which exhibit relative hypoenhancement on the postcontrast images. However, there are no discrete masses. Intervening vessels are unremarkable. Findings are nonspecific and differential diagnosis includes benign etiology such as arterioportal shunting, liver parenchymal disease, infiltrating hepatic steatosis, etc. 2. No intrahepatic or extrahepatic bile duct dilatation. No choledocholithiasis. 3. There are few mildly prominent porta hepatic lymph nodes with largest measuring up to 2.2 x 2.8 cm. This is indeterminate in etiology and likely benign/reactive.   He feels well currently with no complaints.  Plan was for potential liver biopsy if LFTs/alk phos remain elevated.  Lab Results  Component Value Date   ALT 90 (H) 10/12/2024   AST 54 (H) 10/12/2024   ALKPHOS 715 (H) 11/09/2024   BILITOT 1.3 (H) 10/12/2024      Past Medical History:  Diagnosis Date   Bile duct stone    Choledocholithiasis    Cholelithiasis    Dilated bile duct     Hepatic steatosis    Psoriatic arthritis (HCC)    Past Surgical History:  Procedure Laterality Date   ANTERIOR CRUCIATE LIGAMENT REPAIR Right    CHOLECYSTECTOMY N/A 02/21/2016   Procedure: LAPAROSCOPIC CHOLECYSTECTOMY WITH INTRAOPERATIVE CHOLANGIOGRAM;  Surgeon: Krystal Russell, MD;  Location: [facility];  Service: General;  Laterality: N/A;   DENTAL SURGERY     ERCP N/A 02/20/2016   Procedure: ENDOSCOPIC RETROGRADE CHOLANGIOPANCREATOGRAPHY (ERCP);  Surgeon: Norleen Dyane, MD;  Location: [facility];  Service: Endoscopy;  Laterality: N/A;   ERCP N/A 02/22/2016   Procedure: ENDOSCOPIC RETROGRADE CHOLANGIOPANCREATOGRAPHY (ERCP);  Surgeon: Norleen Dyane, MD;  Location: [facility];  Service: Endoscopy;  Laterality: N/A;   ERCP N/A 06/29/2024   Procedure: ERCP, WITH INTERVENTION IF INDICATED;  Surgeon: [name]., MD;  Location: [facility];  Service: Gastroenterology;  Laterality: N/A;   ERCP N/A 09/03/2024   Procedure: ERCP, WITH INTERVENTION IF INDICATED;  Surgeon: [name]., MD;  Location: [facility];  Service: Gastroenterology;  Laterality: N/A;    reports that he has been smoking. He has never used smokeless tobacco. He reports current alcohol use. He reports that he does not use drugs. family history includes Brain cancer in his maternal aunt. Allergies[1]    Outpatient Encounter Medications as of 11/16/2024  Medication Sig   HUMIRA PEN 40 MG/0.8ML PNKT Inject 40 mg as directed every 14 (fourteen) days.   Multiple Vitamin (MULTIVITAMIN WITH MINERALS) TABS Take 1 tablet by mouth every morning.   Omega-3 Fatty Acids (FISH OIL) 500 MG CAPS Take 500 mg by mouth daily.   Probiotic Product (PROBIOTIC PO) Take 1 capsule by mouth daily.  sodium-potassium bicarbonate (ALKA-SELTZER GOLD) TBEF dissolvable tablet Take 2 tablets by mouth 2 (two) times daily as needed (for indigestion/stomach pain.).    No facility-administered encounter medications on file as of 11/16/2024.     REVIEW OF SYSTEMS  : All other systems reviewed and negative except where noted in the History of Present Illness.   PHYSICAL EXAM: BP 132/74   Pulse 68   Ht 6' (1.829 m)   Wt 236 lb 2 oz (107.1 kg)   BMI 32.02 kg/m  General: Well developed white male in no acute distress Head: Normocephalic and atraumatic Eyes:  Sclerae anicteric, conjunctiva pink. Ears: Normal auditory acuity Lungs: Clear throughout to auscultation; no W/R/R. Heart: Regular rate and rhythm; no M/R/G. Abdomen: Soft, non-distended.  BS present.  Non-tender. Musculoskeletal: Symmetrical with no gross deformities  Skin: No lesions on visible extremities Neurological: Alert oriented x 4, grossly non-focal Psychological:  Alert and cooperative. 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  Initially had an ERCP with stent placed in 2017, never returned for follow-up.  Had ERCP with stent removal, sphincteroplasty and some stone extraction with 2 new stents placed in August 2025.  Not all stones were removed so had repeat ERCP 08/2024 with complete stone removal and removal of stents that were in place. ALP remains elevated in the 700s.  Labs for other causes of chronic liver disease were negative.  Will check with Dr. Wilhelmenia to confirm but likely will need liver biopsy.   CC:  [facility]       [1] No Known Allergies  "

## 2024-11-17 ENCOUNTER — Other Ambulatory Visit: Payer: Self-pay

## 2024-11-17 ENCOUNTER — Telehealth: Payer: Self-pay | Admitting: *Deleted

## 2024-11-17 DIAGNOSIS — R748 Abnormal levels of other serum enzymes: Secondary | ICD-10-CM

## 2024-11-17 DIAGNOSIS — R7989 Other specified abnormal findings of blood chemistry: Secondary | ICD-10-CM

## 2024-11-17 NOTE — Progress Notes (Signed)
"   Attending Physician's Attestation   I have reviewed the chart.   I agree with the Advanced Practitioner's note, impression, and recommendations with any updates as below. Proceed with repeat CMP and HFE Gene analysis in next 2 weeks. Based on these if we still have issues (which is suspected) then will need Liver biopsy.   Aloha Finner, MD [facility] Advanced Endoscopy Office # [phone]  "

## 2024-11-17 NOTE — Telephone Encounter (Signed)
 Patient informed to repeat labs in 2 weeks.

## 2024-11-17 NOTE — Telephone Encounter (Signed)
-----   Message from Harlene Mail, PA-C sent at 11/17/2024  9:15 AM EST ----- Please let the patient know that I discussed with Dr. Wilhelmenia.  He would like to repeat labs, CMP and genetic testing for iron overload/hemochromatosis (HFE Gene analysis) in 2 weeks.  If he still has elevated liver enzymes at that time then he would like to proceed with the liver biopsy.  Thank you,  Jess ----- Message ----- From: [name]., MD Sent: 11/17/2024   6:13 AM EST To: Jessica D Zehr, PA-C

## 2024-12-01 ENCOUNTER — Ambulatory Visit: Payer: Self-pay | Admitting: Gastroenterology

## 2024-12-01 ENCOUNTER — Other Ambulatory Visit

## 2024-12-01 DIAGNOSIS — R748 Abnormal levels of other serum enzymes: Secondary | ICD-10-CM

## 2024-12-01 DIAGNOSIS — R7989 Other specified abnormal findings of blood chemistry: Secondary | ICD-10-CM

## 2024-12-01 LAB — COMPREHENSIVE METABOLIC PANEL WITH GFR
ALT: 86 U/L — ABNORMAL HIGH (ref 3–53)
AST: 65 U/L — ABNORMAL HIGH (ref 5–37)
Albumin: 4.1 g/dL (ref 3.5–5.2)
Alkaline Phosphatase: 505 U/L — ABNORMAL HIGH (ref 39–117)
BUN: 10 mg/dL (ref 6–23)
CO2: 29 meq/L (ref 19–32)
Calcium: 9.4 mg/dL (ref 8.4–10.5)
Chloride: 104 meq/L (ref 96–112)
Creatinine, Ser: 1.02 mg/dL (ref 0.40–1.50)
GFR: 89.86 mL/min
Glucose, Bld: 109 mg/dL — ABNORMAL HIGH (ref 70–99)
Potassium: 3.9 meq/L (ref 3.5–5.1)
Sodium: 142 meq/L (ref 135–145)
Total Bilirubin: 1.6 mg/dL — ABNORMAL HIGH (ref 0.2–1.2)
Total Protein: 7.1 g/dL (ref 6.0–8.3)

## 2024-12-02 ENCOUNTER — Other Ambulatory Visit: Payer: Self-pay

## 2024-12-02 DIAGNOSIS — R748 Abnormal levels of other serum enzymes: Secondary | ICD-10-CM

## 2024-12-16 ENCOUNTER — Telehealth: Payer: Self-pay

## 2024-12-16 NOTE — Telephone Encounter (Signed)
-----   Message from Aloha Finner, MD sent at 12/16/2024  4:19 AM EST ----- Regarding: Follow-up I strongly recommend that we move forward with a liver biopsy as has been discussed previously as a result of the persisting liver biochemical testing abnormalities post ERCP. A FibroScan can be helpful, but it is not going to give the the tissue that is needed to truly define why he continues to have such a significant elevation in his alkaline phosphatase. I would strongly recommend we move forward with the biopsy unless patient continues to have issues or concerns. Please move forward with what ever the [facility] needs to get this approved unless the patient declines. Also make sure that we have this documented in the system rather than just an inbox staff message. Thanks. GM ----- Message ----- FromBETHA Micki Izetta JONETTA Sent: 12/15/2024   1:05 PM EST To: Odetta LITTIE Curly, RN; Aloha Finner Raddle., #  Let me know if we need to keep the 2/3 liver biopsy apt- if so, I will have to fill out a request for service along with records and fax them to the [facility]. Once I fax it, the authorization can take up to 14 days to get approved.  Thank you. ----- Message ----- From: Curly Odetta LITTIE, RN Sent: 12/15/2024  12:39 PM EST To: April D McPeak; Aloha Finner Raddle., MD  Dr Finner the [facility] has ordered a Fibro scan and not liver biopsy does he need both? ----- Message ----- FromBETHA Micki Izetta JONETTA Sent: 12/15/2024  12:04 PM EST To: Odetta LITTIE Curly, RN  Tajee Savant, I spoke with Glen Raven with the [facility] in [place]. He told me the patient is set up for US  Liver at the [facility] on Feb 18. Can you please make sure this is correct? If so, we will need to cancel this one on Feb 3. Kent's number is [phone].  Thank you.

## 2024-12-21 ENCOUNTER — Other Ambulatory Visit: Payer: Self-pay | Admitting: Radiology

## 2024-12-21 DIAGNOSIS — R7989 Other specified abnormal findings of blood chemistry: Secondary | ICD-10-CM

## 2024-12-21 NOTE — H&P (Signed)
 "  Chief Complaint: Elevated liver function tests; common bile duct stones/choledocholithiasis ; referred for image guided random liver biopsy for further evaluation  Referring Provider(s): Mansouraty,G  Supervising Physician: Philip Cornet  Patient Status: [facility] - Out-pt  History of Present Illness: Craig Roth is a 44 y.o. 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  Initially had an ERCP with stent placed in 2017, never returned for follow-up.  Had ERCP with stent removal, sphincteroplasty and some stone extraction with 2 new stents placed in August 2025.  Not all stones were removed so had repeat ERCP 08/2024 with complete stone removal and removal of stents that were in place. ALP remains elevated in the 700s.  Labs for other causes of chronic liver disease were negative. He presents today for image guided random core liver biopsy for further evaluation.   *** Patient is Full Code  Past Medical History:  Diagnosis Date   Bile duct stone    Choledocholithiasis    Cholelithiasis    Dilated bile duct    Hepatic steatosis    Psoriatic arthritis (HCC)     Past Surgical History:  Procedure Laterality Date   ANTERIOR CRUCIATE LIGAMENT REPAIR Right    CHOLECYSTECTOMY N/A 02/21/2016   Procedure: LAPAROSCOPIC CHOLECYSTECTOMY WITH INTRAOPERATIVE CHOLANGIOGRAM;  Surgeon: Krystal Russell, MD;  Location: [facility];  Service: General;  Laterality: N/A;   DENTAL SURGERY     ERCP N/A 02/20/2016   Procedure: ENDOSCOPIC RETROGRADE CHOLANGIOPANCREATOGRAPHY (ERCP);  Surgeon: Norleen Hint, MD;  Location: [facility];  Service: Endoscopy;  Laterality: N/A;   ERCP N/A 02/22/2016   Procedure: ENDOSCOPIC RETROGRADE CHOLANGIOPANCREATOGRAPHY (ERCP);  Surgeon: Norleen Hint, MD;  Location: [facility];  Service: Endoscopy;  Laterality: N/A;   ERCP N/A 06/29/2024   Procedure: ERCP, WITH INTERVENTION IF INDICATED;  Surgeon:  [name]., MD;  Location: [facility];  Service: Gastroenterology;  Laterality: N/A;   ERCP N/A 09/03/2024   Procedure: ERCP, WITH INTERVENTION IF INDICATED;  Surgeon: [name]., MD;  Location: [facility];  Service: Gastroenterology;  Laterality: N/A;    Allergies: Patient has no known allergies.  Medications: Prior to Admission medications  Medication Sig Start Date End Date Taking? Authorizing Provider  HUMIRA PEN 40 MG/0.8ML PNKT Inject 40 mg as directed every 14 (fourteen) days. 02/17/16   [provider]  Multiple Vitamin (MULTIVITAMIN WITH MINERALS) TABS Take 1 tablet by mouth every morning.    [provider]  Omega-3 Fatty Acids (FISH OIL) 500 MG CAPS Take 500 mg by mouth daily.    [provider]  Probiotic Product (PROBIOTIC PO) Take 1 capsule by mouth daily.    [provider]  sodium-potassium bicarbonate (ALKA-SELTZER GOLD) TBEF dissolvable tablet Take 2 tablets by mouth 2 (two) times daily as needed (for indigestion/stomach pain.).     [provider]     Family History  Problem Relation Age of Onset   Brain cancer Maternal Aunt     Social History   Socioeconomic History   Marital status: Single    Spouse name: Not on file   Number of children: Not on file   Years of education: Not on file   Highest education level: Not on file  Occupational History   Not on file  Tobacco Use   Smoking status: Some Days   Smokeless tobacco: Never  Substance and Sexual Activity   Alcohol use: Yes    Comment: rarely   Drug  use: No   Sexual activity: Not on file  Other Topics Concern   Not on file  Social History Narrative   Not on file   Social Drivers of Health   Tobacco Use: High Risk (11/16/2024)   Patient History    Smoking Tobacco Use: Some Days    Smokeless Tobacco Use: Never    Passive Exposure: Not on file  Financial Resource Strain: Not on file  Food Insecurity: Not on file   Transportation Needs: Not on file  Physical Activity: Not on file  Stress: Not on file  Social Connections: Not on file  Depression (EYV7-0): Not on file  Alcohol Screen: Not on file  Housing: Not on file  Utilities: Not on file  Health Literacy: Not on file      Review of Systems  Vital Signs:   Advance Care Plan: no documents on file  Physical Exam  Imaging: No results found.  Labs:  CBC: No results for input(s): WBC, HGB, HCT, PLT in the last 8760 hours.  COAGS: No results for input(s): INR, APTT in the last 8760 hours.  BMP: Recent Labs    10/12/24 1445 12/01/24 1324  NA 139 142  K 4.3 3.9  CL 104 104  CO2 27 29  GLUCOSE 157* 109*  BUN 10 10  CALCIUM 9.4 9.4  CREATININE 0.90 1.02    LIVER FUNCTION TESTS: Recent Labs    07/27/24 1632 08/17/24 1502 09/17/24 1444 10/12/24 1445 11/09/24 1305 12/01/24 1324  BILITOT 1.6* 1.6* 1.7* 1.3*  --  1.6*  AST 62* 76* 73* 54*  --  65*  ALT 88* 113* 107* 90*  --  86*  ALKPHOS 643* 743* 771* 632* 715* 505*  PROT 7.7 8.0  --  7.6  --  7.1  ALBUMIN 4.0 4.4  --  4.1  --  4.1    TUMOR MARKERS: No results for input(s): AFPTM, CEA, CA199, CHROMGRNA in the last 8760 hours.  Assessment and Plan: 44 y.o. 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  Initially had an ERCP with stent placed in 2017, never returned for follow-up.  Had ERCP with stent removal, sphincteroplasty and some stone extraction with 2 new stents placed in August 2025.  Not all stones were removed so had repeat ERCP 08/2024 with complete stone removal and removal of stents that were in place. ALP remains elevated in the 700s.  Labs for other causes of chronic liver disease were negative. He presents today for image guided random core liver biopsy for further evaluation. Risks and benefits of procedure was discussed with the patient   including, but not limited to bleeding, infection, damage to adjacent structures or low yield requiring additional tests.  All of the questions were answered and there is agreement to proceed.  Consent signed and in chart.    Thank you for allowing our service to participate in Wake Conlee 's care.  Electronically Signed: D. Franky Rakers, PA-C   12/21/2024, 3:52 PM      I spent a total of 25 minutes    in face to face in clinical consultation, greater than 50% of which was counseling/coordinating care for image guided random core liver biopsy   "

## 2024-12-22 ENCOUNTER — Encounter (HOSPITAL_COMMUNITY): Payer: Self-pay

## 2024-12-22 ENCOUNTER — Ambulatory Visit (HOSPITAL_COMMUNITY)
Admission: RE | Admit: 2024-12-22 | Discharge: 2024-12-22 | Disposition: A | Source: Ambulatory Visit | Attending: Gastroenterology | Admitting: Gastroenterology

## 2024-12-22 ENCOUNTER — Ambulatory Visit (HOSPITAL_COMMUNITY): Admission: RE | Admit: 2024-12-22 | Discharge: 2024-12-22 | Attending: Gastroenterology | Admitting: Gastroenterology

## 2024-12-22 DIAGNOSIS — R7989 Other specified abnormal findings of blood chemistry: Secondary | ICD-10-CM

## 2024-12-22 DIAGNOSIS — R748 Abnormal levels of other serum enzymes: Secondary | ICD-10-CM | POA: Insufficient documentation

## 2024-12-22 DIAGNOSIS — Z8719 Personal history of other diseases of the digestive system: Secondary | ICD-10-CM | POA: Insufficient documentation

## 2024-12-22 DIAGNOSIS — K7460 Unspecified cirrhosis of liver: Secondary | ICD-10-CM | POA: Insufficient documentation

## 2024-12-22 DIAGNOSIS — L4050 Arthropathic psoriasis, unspecified: Secondary | ICD-10-CM | POA: Insufficient documentation

## 2024-12-22 DIAGNOSIS — K760 Fatty (change of) liver, not elsewhere classified: Secondary | ICD-10-CM | POA: Insufficient documentation

## 2024-12-22 LAB — COMPREHENSIVE METABOLIC PANEL WITH GFR
ALT: 131 U/L — ABNORMAL HIGH (ref 0–44)
AST: 97 U/L — ABNORMAL HIGH (ref 15–41)
Albumin: 3.9 g/dL (ref 3.5–5.0)
Alkaline Phosphatase: 491 U/L — ABNORMAL HIGH (ref 38–126)
Anion gap: 10 (ref 5–15)
BUN: 13 mg/dL (ref 6–20)
CO2: 24 mmol/L (ref 22–32)
Calcium: 9.3 mg/dL (ref 8.9–10.3)
Chloride: 108 mmol/L (ref 98–111)
Creatinine, Ser: 1 mg/dL (ref 0.61–1.24)
GFR, Estimated: >60 mL/min
Glucose, Bld: 81 mg/dL (ref 70–99)
Potassium: 4 mmol/L (ref 3.5–5.1)
Sodium: 142 mmol/L (ref 135–145)
Total Bilirubin: 1.3 mg/dL — ABNORMAL HIGH (ref 0.0–1.2)
Total Protein: 7.1 g/dL (ref 6.5–8.1)

## 2024-12-22 LAB — CBC WITH DIFFERENTIAL/PLATELET
Abs Immature Granulocytes: 0.01 10*3/uL (ref 0.00–0.07)
Basophils Absolute: 0 10*3/uL (ref 0.0–0.1)
Basophils Relative: 1 %
Eosinophils Absolute: 0.4 10*3/uL (ref 0.0–0.5)
Eosinophils Relative: 7 %
HCT: 34.8 % — ABNORMAL LOW (ref 39.0–52.0)
Hemoglobin: 11.2 g/dL — ABNORMAL LOW (ref 13.0–17.0)
Immature Granulocytes: 0 %
Lymphocytes Relative: 31 %
Lymphs Abs: 1.6 10*3/uL (ref 0.7–4.0)
MCH: 29.9 pg (ref 26.0–34.0)
MCHC: 32.2 g/dL (ref 30.0–36.0)
MCV: 93 fL (ref 80.0–100.0)
Monocytes Absolute: 0.5 10*3/uL (ref 0.1–1.0)
Monocytes Relative: 9 %
Neutro Abs: 2.8 10*3/uL (ref 1.7–7.7)
Neutrophils Relative %: 52 %
Platelets: 173 10*3/uL (ref 150–400)
RBC: 3.74 MIL/uL — ABNORMAL LOW (ref 4.22–5.81)
RDW: 13.7 % (ref 11.5–15.5)
WBC: 5.3 10*3/uL (ref 4.0–10.5)
nRBC: 0 % (ref 0.0–0.2)

## 2024-12-22 LAB — PROTIME-INR
INR: 1 (ref 0.8–1.2)
Prothrombin Time: 13.7 s (ref 11.4–15.2)

## 2024-12-22 MED ORDER — SODIUM CHLORIDE 0.9 % IV SOLN: INTRAVENOUS | Status: DC

## 2024-12-22 MED ORDER — GELATIN ABSORBABLE 12-7 MM EX MISC
CUTANEOUS | Status: AC
  Filled 2024-12-22: qty 1

## 2024-12-22 MED ORDER — FENTANYL CITRATE (PF) 100 MCG/2ML IJ SOLN
INTRAMUSCULAR | Status: AC | PRN
  Administered 2024-12-22: 50 ug via INTRAVENOUS

## 2024-12-22 MED ORDER — MIDAZOLAM HCL (PF) 2 MG/2ML IJ SOLN
INTRAMUSCULAR | Status: AC | PRN
  Administered 2024-12-22: 1 mg via INTRAVENOUS

## 2024-12-22 MED ORDER — MIDAZOLAM HCL 2 MG/2ML IJ SOLN
INTRAMUSCULAR | Status: AC
  Filled 2024-12-22: qty 2

## 2024-12-22 MED ORDER — FENTANYL CITRATE (PF) 100 MCG/2ML IJ SOLN
INTRAMUSCULAR | Status: AC
  Filled 2024-12-22: qty 2

## 2024-12-22 MED ORDER — HYDROCODONE-ACETAMINOPHEN 5-325 MG PO TABS
1.0000 | ORAL_TABLET | ORAL | Status: DC | PRN
  Administered 2024-12-22: 1 via ORAL
  Filled 2024-12-22: qty 1

## 2024-12-22 NOTE — Sedation Documentation (Signed)
 RN Welborn Keena pulled 2 mg Versed  and 100 mcg Fentanyl  in US  pysix. Pt. Received 2 mg Versed  and 100 mcg Fentanyl  throughout the procedure.

## 2024-12-22 NOTE — Procedures (Signed)
 Interventional Radiology Procedure:   Indications: Elevated liver enzymes  Procedure: US  guided liver biopsy  Findings: 3 core biopsies from right hepatic lobe.  Gelfoam slurry injected along biopsy tract.    Complications: None     EBL: Minimal  Plan: Bedrest 2 hours   Frimet Durfee R. Philip, MD  Pager: [phone]

## 2024-12-23 LAB — SURGICAL PATHOLOGY

## 2024-12-25 ENCOUNTER — Ambulatory Visit: Payer: Self-pay | Admitting: Gastroenterology
